# Patient Record
Sex: Male | Born: 1980 | Race: Black or African American | Hispanic: No | Marital: Single | Smoking: Never smoker
Health system: Southern US, Community
[De-identification: ages and names within clinical notes are randomized; demographics above are authoritative.]

## PROBLEM LIST (undated history)

## (undated) DIAGNOSIS — G43909 Migraine, unspecified, not intractable, without status migrainosus: Secondary | ICD-10-CM

---

## 2005-11-24 ENCOUNTER — Emergency Department (HOSPITAL_COMMUNITY): Admission: EM | Admit: 2005-11-24 | Discharge: 2005-11-24 | Payer: Self-pay | Admitting: Emergency Medicine

## 2006-03-06 ENCOUNTER — Emergency Department (HOSPITAL_COMMUNITY): Admission: EM | Admit: 2006-03-06 | Discharge: 2006-03-06 | Payer: Self-pay | Admitting: Family Medicine

## 2008-03-09 ENCOUNTER — Emergency Department (HOSPITAL_COMMUNITY): Admission: EM | Admit: 2008-03-09 | Discharge: 2008-03-09 | Payer: Self-pay | Admitting: Family Medicine

## 2011-09-28 ENCOUNTER — Emergency Department (INDEPENDENT_AMBULATORY_CARE_PROVIDER_SITE_OTHER): Payer: Self-pay

## 2011-09-28 ENCOUNTER — Emergency Department (INDEPENDENT_AMBULATORY_CARE_PROVIDER_SITE_OTHER)
Admission: EM | Admit: 2011-09-28 | Discharge: 2011-09-28 | Disposition: A | Discharge status: Home / Self Care | Payer: Self-pay | Source: Home / Self Care

## 2011-09-28 DIAGNOSIS — S139XXA Sprain of joints and ligaments of unspecified parts of neck, initial encounter: Secondary | ICD-10-CM

## 2011-09-28 DIAGNOSIS — S161XXA Strain of muscle, fascia and tendon at neck level, initial encounter: Secondary | ICD-10-CM

## 2011-09-28 MED ORDER — NAPROXEN 500 MG PO TABS: 500.0000 mg | ORAL_TABLET | Freq: Two times a day (BID) | ORAL | Status: AC

## 2011-09-28 MED ORDER — CYCLOBENZAPRINE HCL 5 MG PO TABS: 5.0000 mg | ORAL_TABLET | Freq: Three times a day (TID) | ORAL | Status: AC | PRN

## 2011-09-28 NOTE — ED Provider Notes (Signed)
History     CSN: [id number] Arrival date & time: 09/28/2011  4:35 PM   None     Chief Complaint  Patient presents with  . Numbness    Pt has lt arm numbness from elbow to top of head for three days, no known injury    (Consider location/radiation/quality/duration/timing/severity/associated sxs/prior treatment) HPI Comments: Pt woke up with feeling that arm, shoulder, neck and face on L side were numb, "like I slept on it funny". Gradually this feeling has changed to pain that is worsening in severity.  Denies injury. Pt says does get headaches, has been getting for years since adolescence, worse lately. Headaches are across forehead  Patient is a 30 y.o. male presenting with extremity pain.  Extremity Pain This is a new problem. Episode onset: 3 days. The problem occurs constantly. The problem has been gradually worsening. Associated symptoms include headaches. Pertinent negatives include no chest pain. Exacerbated by: sleeping and touching. The symptoms are relieved by nothing. He has tried nothing for the symptoms.    History reviewed. No pertinent past medical history.  History reviewed. No pertinent past surgical history.  History reviewed. No pertinent family history.  History  Substance Use Topics  . Smoking status: Never Smoker   . Smokeless tobacco: Not on file  . Alcohol Use: No      Review of Systems  Constitutional: Negative for fever and chills.  Cardiovascular: Negative for chest pain.  Musculoskeletal: Negative for back pain and joint swelling.  Skin: Negative for rash.  Neurological: Positive for numbness and headaches. Negative for weakness.    Allergies  Review of patient's allergies indicates no known allergies.  Home Medications   Current Outpatient Rx  Name Route Sig Dispense Refill  . CYCLOBENZAPRINE HCL 5 MG PO TABS Oral Take 1 tablet (5 mg total) by mouth 3 (three) times daily as needed for muscle spasms. 20 tablet 0  . NAPROXEN 500 MG PO  TABS Oral Take 1 tablet (500 mg total) by mouth 2 (two) times daily with a meal. 14 tablet 0    BP 136/82  Pulse 69  Temp(Src) 98.7 F (37.1 C) (Oral)  Resp 16  SpO2 97%  Physical Exam  Constitutional: He appears well-developed and well-nourished. No distress.  Neck: Normal range of motion. Neck supple. No edema and no erythema present.    Cardiovascular: Normal rate and regular rhythm.   Pulmonary/Chest: Effort normal and breath sounds normal.  Musculoskeletal: Normal range of motion. He exhibits no edema.       Right shoulder: He exhibits tenderness. He exhibits normal range of motion, no swelling, no effusion, no deformity and normal strength.       Arms: Neurological: He is alert. He has normal strength. No sensory deficit.    ED Course  Procedures (including critical care time)  Labs Reviewed - No data to display Dg Cervical Spine Complete  09/28/2011  *RADIOLOGY REPORT*  Clinical Data: Left-sided neck pain and numbness.  CERVICAL SPINE - COMPLETE 4+ VIEW  Comparison: None available.  Findings: The cervical spine is visualized from the skull base through the cervicothoracic junction.  The prevertebral soft tissues are normal.  The vertebral body heights and alignment are normal.  No acute fracture or traumatic subluxation is evident. There is straightening of the normal cervical lordosis.  IMPRESSION:  1.  No acute osseous abnormality. 2.  Mild straightening of the normal cervical lordosis.  This is nonspecific, but can be seen in the setting of muscle strain  or ongoing pain.  Original Report Authenticated By: Jamesetta Orleans. MATTERN, M.D.     1. Cervical muscle strain       MDM           Cathlyn Parsons, NP 09/28/11 [phone]

## 2011-09-28 NOTE — ED Provider Notes (Signed)
Medical screening examination/treatment/procedure(s) were performed by non-physician practitioner and as supervising physician I was immediately available for consultation/collaboration.  Raynald Blend, MD 09/28/11 2109

## 2013-08-30 ENCOUNTER — Emergency Department (INDEPENDENT_AMBULATORY_CARE_PROVIDER_SITE_OTHER)
Admission: EM | Admit: 2013-08-30 | Discharge: 2013-08-30 | Disposition: A | Discharge status: Home / Self Care | Payer: Self-pay | Source: Home / Self Care

## 2013-08-30 ENCOUNTER — Encounter (HOSPITAL_COMMUNITY): Payer: Self-pay | Admitting: Emergency Medicine

## 2013-08-30 DIAGNOSIS — K047 Periapical abscess without sinus: Secondary | ICD-10-CM

## 2013-08-30 DIAGNOSIS — K044 Acute apical periodontitis of pulpal origin: Secondary | ICD-10-CM

## 2013-08-30 MED ORDER — HYDROCODONE-ACETAMINOPHEN 5-325 MG PO TABS: 1.0000 | ORAL_TABLET | Freq: Four times a day (QID) | ORAL | Status: DC | PRN

## 2013-08-30 MED ORDER — CLINDAMYCIN HCL 300 MG PO CAPS: 300.0000 mg | ORAL_CAPSULE | Freq: Three times a day (TID) | ORAL | Status: DC

## 2013-08-30 NOTE — ED Notes (Signed)
Call from patient, unable to afford the antibiotics prescribed, asking for something less expensive. Spoke w DR Griffin Basil, who authorized Pen V K 500 mg  PO, QID, x #28, NR; called in to [facility] [address] at pt request, left message for pharmacy staff

## 2013-08-30 NOTE — ED Notes (Signed)
Toothache  X  2  Weeks  Facial swelling  Last  Pm

## 2013-08-30 NOTE — ED Provider Notes (Signed)
Joshua Spencer is a 32 y.o. male who presents to Urgent Care today for dental pain for 2 weeks. Patient has been taking Tylenol or ibuprofen for mild to moderate abdominal pain. However last night he had swelling of his right jaw. He notes mild chills but denies any fevers nausea vomiting or diarrhea. He is able to drink normally and is not having any trouble swallowing. He notes his pain is worse with eating. He feels well otherwise. He does not have dental insurance.   No past medical history on file. History  Substance Use Topics  . Smoking status: Never Smoker   . Smokeless tobacco: Not on file  . Alcohol Use: No   ROS as above Medications reviewed. No current facility-administered medications for this encounter.   Current Outpatient Prescriptions  Medication Sig Dispense Refill  . clindamycin (CLEOCIN) 300 MG capsule Take 1 capsule (300 mg total) by mouth 3 (three) times daily.  60 capsule  0  . HYDROcodone-acetaminophen (NORCO/VICODIN) 5-325 MG per tablet Take 1 tablet by mouth every 6 (six) hours as needed.  15 tablet  0    Exam:  BP 129/85  Pulse 84  Temp(Src) 98.7 F (37.1 C) (Oral)  Resp 16  SpO2 99% Gen: Well NAD HEENT: EOMI,  MMM, mild swelling of the right lower jaw.  Right lower premolar with large cavity and surrounding gumline erythema. No definitive abscess seen. Tender to touch   I looked at the patient up on the [place] controlled substances database. He has no narcotics for the last 3 months. No results found for this or any previous visit (from the past 24 hour(s)). No results found.  Assessment and Plan: 32 y.o. male with dental pain due to dental infection.  Plan to treat infection with clindamycin and pain with Norco. I have referred patient to perform full dentures and Dr. Lawrence Marseilles. Discussed warning signs or symptoms. Please see discharge instructions. Patient expresses understanding.      Rodolph Bong, MD 08/30/13 [phone]

## 2014-06-04 ENCOUNTER — Emergency Department (INDEPENDENT_AMBULATORY_CARE_PROVIDER_SITE_OTHER)
Admission: EM | Admit: 2014-06-04 | Discharge: 2014-06-04 | Disposition: A | Discharge status: Home / Self Care | Payer: Self-pay | Source: Home / Self Care

## 2014-06-04 ENCOUNTER — Encounter (HOSPITAL_COMMUNITY): Payer: Self-pay | Admitting: Emergency Medicine

## 2014-06-04 DIAGNOSIS — S339XXA Sprain of unspecified parts of lumbar spine and pelvis, initial encounter: Secondary | ICD-10-CM

## 2014-06-04 DIAGNOSIS — T148XXA Other injury of unspecified body region, initial encounter: Secondary | ICD-10-CM

## 2014-06-04 DIAGNOSIS — S335XXA Sprain of ligaments of lumbar spine, initial encounter: Secondary | ICD-10-CM

## 2014-06-04 DIAGNOSIS — S39012A Strain of muscle, fascia and tendon of lower back, initial encounter: Secondary | ICD-10-CM

## 2014-06-04 MED ORDER — KETOROLAC TROMETHAMINE 60 MG/2ML IM SOLN
60.0000 mg | Freq: Once | INTRAMUSCULAR | Status: AC
  Administered 2014-06-04: 60 mg via INTRAMUSCULAR

## 2014-06-04 MED ORDER — DICLOFENAC SODIUM 1 % TD GEL
1.0000 | Freq: Four times a day (QID) | TRANSDERMAL | Status: DC
Start: 2014-06-04 — End: 2016-10-24

## 2014-06-04 MED ORDER — METHYLPREDNISOLONE SODIUM SUCC 125 MG IJ SOLR
80.0000 mg | Freq: Once | INTRAMUSCULAR | Status: AC
  Administered 2014-06-04: 80 mg via INTRAMUSCULAR

## 2014-06-04 MED ORDER — KETOROLAC TROMETHAMINE 60 MG/2ML IM SOLN
INTRAMUSCULAR | Status: AC
  Filled 2014-06-04: qty 2

## 2014-06-04 MED ORDER — METHYLPREDNISOLONE SODIUM SUCC 125 MG IJ SOLR
INTRAMUSCULAR | Status: AC
  Filled 2014-06-04: qty 2

## 2014-06-04 MED ORDER — TRAMADOL HCL 50 MG PO TABS: 50.0000 mg | ORAL_TABLET | Freq: Four times a day (QID) | ORAL | Status: DC | PRN

## 2014-06-04 NOTE — ED Notes (Signed)
C/o pain low back since Monday or Tuesday. Denies radiation of pain or any numbness ; denies saddle anesthesia ; denies injury . Works at [employer], and his job involves Education officer, museumlifting electronic merchandise (TVs). Tried taking it a little easier , and using a foam support on his bed w/o relief

## 2014-06-04 NOTE — ED Provider Notes (Signed)
CSN: [id number]     Arrival date & time 06/04/14  1510 History   First MD Initiated Contact with Patient 06/04/14 1547     Chief Complaint  Patient presents with  . Back Pain   (Consider location/radiation/quality/duration/timing/severity/associated sxs/prior Treatment) HPI Comments: 33 year old male works at [employer] lifting moderate weight objects repetitively he is complaining of pain across the low back. This started approximately 5-6 days ago has been getting worse. He is trying to work through it. He is complaining of pain across the mid low paralumbar musculature. It is worse with bending, pulling, pushing and lifting. There is no radicular pain, radiation of pain, paresthesias or focal motor weakness.   History reviewed. No pertinent past medical history. History reviewed. No pertinent past surgical history. History reviewed. No pertinent family history. History  Substance Use Topics  . Smoking status: Never Smoker   . Smokeless tobacco: Not on file  . Alcohol Use: No    Review of Systems  Constitutional: Negative.   Respiratory: Negative.   Gastrointestinal: Negative.   Genitourinary: Negative.   Musculoskeletal: Positive for back pain. Negative for neck pain.       As per HPI  Skin: Negative.   Neurological: Positive for headaches. Negative for dizziness, weakness and numbness.    Allergies  Review of patient's allergies indicates no known allergies.  Home Medications   Prior to Admission medications   Medication Sig Start Date End Date Taking? Authorizing Provider  diclofenac sodium (VOLTAREN) 1 % GEL Apply 1 application topically 4 (four) times daily. 06/04/14   Hayden Rasmussenavid Oriyah Lamphear, NP  traMADol (ULTRAM) 50 MG tablet Take 1 tablet (50 mg total) by mouth every 6 (six) hours as needed. 06/04/14   Hayden Rasmussenavid Irasema Chalk, NP   BP 128/83  Pulse 79  Temp(Src) 98.6 F (37 C)  SpO2 97% Physical Exam  Nursing note and vitals reviewed. Constitutional: He is oriented to person, place, and  time. He appears well-developed and well-nourished. No distress.  Neck: Normal range of motion. Neck supple.  Cardiovascular: Normal rate.   Pulmonary/Chest: Effort normal. No respiratory distress.  Musculoskeletal: He exhibits no edema.  Tenderness across the mid lower back and para lumbar musculature. No palpable spinal deformity.  Neurological: He is alert and oriented to person, place, and time. He exhibits normal muscle tone.  Skin: Skin is warm and dry.  Psychiatric: He has a normal mood and affect.    ED Course  Procedures (including critical care time) Labs Review Labs Reviewed - No data to display  Imaging Review No results found.   MDM   1. Lumbosacral strain, initial encounter   2. Muscle strain    Diclofenac gel and cover with heat wraps. Limit lifting pulling and other maneuvers that increased stress on the back. Ultram 50 mg #15 Toradol 60 mg IM and a Medrol 80 mg IM now. Followup with PCP.      Hayden Rasmussenavid Zaineb Nowaczyk, NP 06/04/14 [phone]

## 2014-06-04 NOTE — Discharge Instructions (Signed)
Low Back Sprain with Rehab  A sprain is an injury in which a ligament is torn. The ligaments of the lower back are vulnerable to sprains. However, they are strong and require great force to be injured. These ligaments are important for stabilizing the spinal column. Sprains are classified into three categories. Grade 1 sprains cause pain, but the tendon is not lengthened. Grade 2 sprains include a lengthened ligament, due to the ligament being stretched or partially ruptured. With grade 2 sprains there is still function, although the function may be decreased. Grade 3 sprains involve a complete tear of the tendon or muscle, and function is usually impaired. SYMPTOMS   Severe pain in the lower back.  Sometimes, a feeling of a "pop," "snap," or tear, at the time of injury.  Tenderness and sometimes swelling at the injury site.  Uncommonly, bruising (contusion) within 48 hours of injury.  Muscle spasms in the back. CAUSES  Low back sprains occur when a force is placed on the ligaments that is greater than they can handle. Common causes of injury include:  Performing a stressful act while off-balance.  Repetitive stressful activities that involve movement of the lower back.  Direct hit (trauma) to the lower back. RISK INCREASES WITH:  Contact sports (football, wrestling).  Collisions (major skiing accidents).  Sports that require throwing or lifting (baseball, weightlifting).  Sports involving twisting of the spine (gymnastics, diving, tennis, golf).  Poor strength and flexibility.  Inadequate protection.  Previous back injury or surgery (especially fusion). PREVENTION  Wear properly fitted and padded protective equipment.  Warm up and stretch properly before activity.  Allow for adequate recovery between workouts.  Maintain physical fitness:  Strength, flexibility, and endurance.  Cardiovascular fitness.  Maintain a healthy body weight. PROGNOSIS  If treated  properly, low back sprains usually heal with non-surgical treatment. The length of time for healing depends on the severity of the injury.  RELATED COMPLICATIONS   Recurring symptoms, resulting in a chronic problem.  Chronic inflammation and pain in the low back.  Delayed healing or resolution of symptoms, especially if activity is resumed too soon.  Prolonged impairment.  Unstable or arthritic joints of the low back. TREATMENT  Treatment first involves the use of ice and medicine, to reduce pain and inflammation. The use of strengthening and stretching exercises may help reduce pain with activity. These exercises may be performed at home or with a therapist. Severe injuries may require referral to a therapist for further evaluation and treatment, such as ultrasound. Your caregiver may advise that you wear a back brace or corset, to help reduce pain and discomfort. Often, prolonged bed rest results in greater harm then benefit. Corticosteroid injections may be recommended. However, these should be reserved for the most serious cases. It is important to avoid using your back when lifting objects. At night, sleep on your back on a firm mattress, with a pillow placed under your knees. If non-surgical treatment is unsuccessful, surgery may be needed.  MEDICATION   If pain medicine is needed, nonsteroidal anti-inflammatory medicines (aspirin and ibuprofen), or other minor pain relievers (acetaminophen), are often advised.  Do not take pain medicine for 7 days before surgery.  Prescription pain relievers may be given, if your caregiver thinks they are needed. Use only as directed and only as much as you need.  Ointments applied to the skin may be helpful.  Corticosteroid injections may be given by your caregiver. These injections should be reserved for the most serious cases,   because they may only be given a certain number of times. HEAT AND COLD  Cold treatment (icing) should be applied for 10  to 15 minutes every 2 to 3 hours for inflammation and pain, and immediately after activity that aggravates your symptoms. Use ice packs or an ice massage.  Heat treatment may be used before performing stretching and strengthening activities prescribed by your caregiver, physical therapist, or athletic trainer. Use a heat pack or a warm water soak. SEEK MEDICAL CARE IF:   Symptoms get worse or do not improve in 2 to 4 weeks, despite treatment.  You develop numbness or weakness in either leg.  You lose bowel or bladder function.  Any of the following occur after surgery: fever, increased pain, swelling, redness, drainage of fluids, or bleeding in the affected area.  New, unexplained symptoms develop. (Drugs used in treatment may produce side effects.) EXERCISES  RANGE OF MOTION (ROM) AND STRETCHING EXERCISES - Low Back Sprain Most people with lower back pain will find that their symptoms get worse with excessive bending forward (flexion) or arching at the lower back (extension). The exercises that will help resolve your symptoms will focus on the opposite motion.  Your physician, physical therapist or athletic trainer will help you determine which exercises will be most helpful to resolve your lower back pain. Do not complete any exercises without first consulting with your caregiver. Discontinue any exercises which make your symptoms worse, until you speak to your caregiver. If you have pain, numbness or tingling which travels down into your buttocks, leg or foot, the goal of the therapy is for these symptoms to move closer to your back and eventually resolve. Sometimes, these leg symptoms will get better, but your lower back pain may worsen. This is often an indication of progress in your rehabilitation. Be very alert to any changes in your symptoms and the activities in which you participated in the 24 hours prior to the change. Sharing this information with your caregiver will allow him or her to  most efficiently treat your condition. These exercises may help you when beginning to rehabilitate your injury. Your symptoms may resolve with or without further involvement from your physician, physical therapist or athletic trainer. While completing these exercises, remember:   Restoring tissue flexibility helps normal motion to return to the joints. This allows healthier, less painful movement and activity.  An effective stretch should be held for at least 30 seconds.  A stretch should never be painful. You should only feel a gentle lengthening or release in the stretched tissue. FLEXION RANGE OF MOTION AND STRETCHING EXERCISES: STRETCH - Flexion, Single Knee to Chest   Lie on a firm bed or floor with both legs extended in front of you.  Keeping one leg in contact with the floor, bring your opposite knee to your chest. Hold your leg in place by either grabbing behind your thigh or at your knee.  Pull until you feel a gentle stretch in your low back. Hold __________ seconds.  Slowly release your grasp and repeat the exercise with the opposite side. Repeat __________ times. Complete this exercise __________ times per day.  STRETCH - Flexion, Double Knee to Chest  Lie on a firm bed or floor with both legs extended in front of you.  Keeping one leg in contact with the floor, bring your opposite knee to your chest.  Tense your stomach muscles to support your back and then lift your other knee to your chest. Hold your legs   in place by either grabbing behind your thighs or at your knees.  Pull both knees toward your chest until you feel a gentle stretch in your low back. Hold __________ seconds.  Tense your stomach muscles and slowly return one leg at a time to the floor. Repeat __________ times. Complete this exercise __________ times per day.  STRETCH - Low Trunk Rotation  Lie on a firm bed or floor. Keeping your legs in front of you, bend your knees so they are both pointed toward the  ceiling and your feet are flat on the floor.  Extend your arms out to the side. This will stabilize your upper body by keeping your shoulders in contact with the floor.  Gently and slowly drop both knees together to one side until you feel a gentle stretch in your low back. Hold for __________ seconds.  Tense your stomach muscles to support your lower back as you bring your knees back to the starting position. Repeat the exercise to the other side. Repeat __________ times. Complete this exercise __________ times per day  EXTENSION RANGE OF MOTION AND FLEXIBILITY EXERCISES: STRETCH - Extension, Prone on Elbows   Lie on your stomach on the floor, a bed will be too soft. Place your palms about shoulder width apart and at the height of your head.  Place your elbows under your shoulders. If this is too painful, stack pillows under your chest.  Allow your body to relax so that your hips drop lower and make contact more completely with the floor.  Hold this position for __________ seconds.  Slowly return to lying flat on the floor. Repeat __________ times. Complete this exercise __________ times per day.  RANGE OF MOTION - Extension, Prone Press Ups  Lie on your stomach on the floor, a bed will be too soft. Place your palms about shoulder width apart and at the height of your head.  Keeping your back as relaxed as possible, slowly straighten your elbows while keeping your hips on the floor. You may adjust the placement of your hands to maximize your comfort. As you gain motion, your hands will come more underneath your shoulders.  Hold this position __________ seconds.  Slowly return to lying flat on the floor. Repeat __________ times. Complete this exercise __________ times per day.  RANGE OF MOTION- Quadruped, Neutral Spine   Assume a hands and knees position on a firm surface. Keep your hands under your shoulders and your knees under your hips. You may place padding under your knees for  comfort.  Drop your head and point your tailbone toward the ground below you. This will round out your lower back like an angry cat. Hold this position for __________ seconds.  Slowly lift your head and release your tail bone so that your back sags into a large arch, like an old horse.  Hold this position for __________ seconds.  Repeat this until you feel limber in your low back.  Now, find your "sweet spot." This will be the most comfortable position somewhere between the two previous positions. This is your neutral spine. Once you have found this position, tense your stomach muscles to support your low back.  Hold this position for __________ seconds. Repeat __________ times. Complete this exercise __________ times per day.  STRENGTHENING EXERCISES - Low Back Sprain These exercises may help you when beginning to rehabilitate your injury. These exercises should be done near your "sweet spot." This is the neutral, low-back arch, somewhere between fully rounded   and fully arched, that is your least painful position. When performed in this safe range of motion, these exercises can be used for people who have either a flexion or extension based injury. These exercises may resolve your symptoms with or without further involvement from your physician, physical therapist or athletic trainer. While completing these exercises, remember:   Muscles can gain both the endurance and the strength needed for everyday activities through controlled exercises.  Complete these exercises as instructed by your physician, physical therapist or athletic trainer. Increase the resistance and repetitions only as guided.  You may experience muscle soreness or fatigue, but the pain or discomfort you are trying to eliminate should never worsen during these exercises. If this pain does worsen, stop and make certain you are following the directions exactly. If the pain is still present after adjustments, discontinue the  exercise until you can discuss the trouble with your caregiver. STRENGTHENING - Deep Abdominals, Pelvic Tilt   Lie on a firm bed or floor. Keeping your legs in front of you, bend your knees so they are both pointed toward the ceiling and your feet are flat on the floor.  Tense your lower abdominal muscles to press your low back into the floor. This motion will rotate your pelvis so that your tail bone is scooping upwards rather than pointing at your feet or into the floor. With a gentle tension and even breathing, hold this position for __________ seconds. Repeat __________ times. Complete this exercise __________ times per day.  STRENGTHENING - Abdominals, Crunches   Lie on a firm bed or floor. Keeping your legs in front of you, bend your knees so they are both pointed toward the ceiling and your feet are flat on the floor. Cross your arms over your chest.  Slightly tip your chin down without bending your neck.  Tense your abdominals and slowly lift your trunk high enough to just clear your shoulder blades. Lifting higher can put excessive stress on the lower back and does not further strengthen your abdominal muscles.  Control your return to the starting position. Repeat __________ times. Complete this exercise __________ times per day.  STRENGTHENING - Quadruped, Opposite UE/LE Lift   Assume a hands and knees position on a firm surface. Keep your hands under your shoulders and your knees under your hips. You may place padding under your knees for comfort.  Find your neutral spine and gently tense your abdominal muscles so that you can maintain this position. Your shoulders and hips should form a rectangle that is parallel with the floor and is not twisted.  Keeping your trunk steady, lift your right hand no higher than your shoulder and then your left leg no higher than your hip. Make sure you are not holding your breath. Hold this position for __________ seconds.  Continuing to keep  your abdominal muscles tense and your back steady, slowly return to your starting position. Repeat with the opposite arm and leg. Repeat __________ times. Complete this exercise __________ times per day.  STRENGTHENING - Abdominals and Quadriceps, Straight Leg Raise   Lie on a firm bed or floor with both legs extended in front of you.  Keeping one leg in contact with the floor, bend the other knee so that your foot can rest flat on the floor.  Find your neutral spine, and tense your abdominal muscles to maintain your spinal position throughout the exercise.  Slowly lift your straight leg off the floor about 6 inches for a count   of 15, making sure to not hold your breath.  Still keeping your neutral spine, slowly lower your leg all the way to the floor. Repeat this exercise with each leg __________ times. Complete this exercise __________ times per day. POSTURE AND BODY MECHANICS CONSIDERATIONS - Low Back Sprain Keeping correct posture when sitting, standing or completing your activities will reduce the stress put on different body tissues, allowing injured tissues a chance to heal and limiting painful experiences. The following are general guidelines for improved posture. Your physician or physical therapist will provide you with any instructions specific to your needs. While reading these guidelines, remember:  The exercises prescribed by your provider will help you have the flexibility and strength to maintain correct postures.  The correct posture provides the best environment for your joints to work. All of your joints have less wear and tear when properly supported by a spine with good posture. This means you will experience a healthier, less painful body.  Correct posture must be practiced with all of your activities, especially prolonged sitting and standing. Correct posture is as important when doing repetitive low-stress activities (typing) as it is when doing a single heavy-load  activity (lifting). RESTING POSITIONS Consider which positions are most painful for you when choosing a resting position. If you have pain with flexion-based activities (sitting, bending, stooping, squatting), choose a position that allows you to rest in a less flexed posture. You would want to avoid curling into a fetal position on your side. If your pain worsens with extension-based activities (prolonged standing, working overhead), avoid resting in an extended position such as sleeping on your stomach. Most people will find more comfort when they rest with their spine in a more neutral position, neither too rounded nor too arched. Lying on a non-sagging bed on your side with a pillow between your knees, or on your back with a pillow under your knees will often provide some relief. Keep in mind, being in any one position for a prolonged period of time, no matter how correct your posture, can still lead to stiffness. PROPER SITTING POSTURE In order to minimize stress and discomfort on your spine, you must sit with correct posture. Sitting with good posture should be effortless for a healthy body. Returning to good posture is a gradual process. Many people can work toward this most comfortably by using various supports until they have the flexibility and strength to maintain this posture on their own. When sitting with proper posture, your ears will fall over your shoulders and your shoulders will fall over your hips. You should use the back of the chair to support your upper back. Your lower back will be in a neutral position, just slightly arched. You may place a small pillow or folded towel at the base of your lower back for  support.  When working at a desk, create an environment that supports good, upright posture. Without extra support, muscles tire, which leads to excessive strain on joints and other tissues. Keep these recommendations in mind: CHAIR:  A chair should be able to slide under your desk  when your back makes contact with the back of the chair. This allows you to work closely.  The chair's height should allow your eyes to be level with the upper part of your monitor and your hands to be slightly lower than your elbows. BODY POSITION  Your feet should make contact with the floor. If this is not possible, use a foot rest.  Keep your   ears over your shoulders. This will reduce stress on your neck and low back. INCORRECT SITTING POSTURES  If you are feeling tired and unable to assume a healthy sitting posture, do not slouch or slump. This puts excessive strain on your back tissues, causing more damage and pain. Healthier options include:  Using more support, like a lumbar pillow.  Switching tasks to something that requires you to be upright or walking.  Talking a brief walk.  Lying down to rest in a neutral-spine position. PROLONGED STANDING WHILE SLIGHTLY LEANING FORWARD  When completing a task that requires you to lean forward while standing in one place for a long time, place either foot up on a stationary 2-4 inch high object to help maintain the best posture. When both feet are on the ground, the lower back tends to lose its slight inward curve. If this curve flattens (or becomes too large), then the back and your other joints will experience too much stress, tire more quickly, and can cause pain. CORRECT STANDING POSTURES Proper standing posture should be assumed with all daily activities, even if they only take a few moments, like when brushing your teeth. As in sitting, your ears should fall over your shoulders and your shoulders should fall over your hips. You should keep a slight tension in your abdominal muscles to brace your spine. Your tailbone should point down to the ground, not behind your body, resulting in an over-extended swayback posture.  INCORRECT STANDING POSTURES  Common incorrect standing postures include a forward head, locked knees and/or an excessive  swayback. WALKING Walk with an upright posture. Your ears, shoulders and hips should all line-up. PROLONGED ACTIVITY IN A FLEXED POSITION When completing a task that requires you to bend forward at your waist or lean over a low surface, try to find a way to stabilize 3 out of 4 of your limbs. You can place a hand or elbow on your thigh or rest a knee on the surface you are reaching across. This will provide you more stability, so that your muscles do not tire as quickly. By keeping your knees relaxed, or slightly bent, you will also reduce stress across your lower back. CORRECT LIFTING TECHNIQUES DO :  Assume a wide stance. This will provide you more stability and the opportunity to get as close as possible to the object which you are lifting.  Tense your abdominals to brace your spine. Bend at the knees and hips. Keeping your back locked in a neutral-spine position, lift using your leg muscles. Lift with your legs, keeping your back straight.  Test the weight of unknown objects before attempting to lift them.  Try to keep your elbows locked down at your sides in order get the best strength from your shoulders when carrying an object.  Always ask for help when lifting heavy or awkward objects. INCORRECT LIFTING TECHNIQUES DO NOT:   Lock your knees when lifting, even if it is a small object.  Bend and twist. Pivot at your feet or move your feet when needing to change directions.  Assume that you can safely pick up even a paperclip without proper posture. Document Released: 10/07/2005 Document Revised: 12/30/2011 Document Reviewed: 01/19/2009 [facility] Patient Information 2015 [place], [place]. This information is not intended to replace advice given to you by your health care provider. Make sure you discuss any questions you have with your health care provider.  Muscle Strain A muscle strain (pulled muscle) happens when a muscle is stretched beyond normal  length. It happens when a sudden,  violent force stretches your muscle too far. Usually, a few of the fibers in your muscle are torn. Muscle strain is common in athletes. Recovery usually takes 1-2 weeks. Complete healing takes 5-6 weeks.  HOME CARE   Follow the PRICE method of treatment to help your injury get better. Do this the first 2-3 days after the injury:  Protect. Protect the muscle to keep it from getting injured again.  Rest. Limit your activity and rest the injured body part.  Ice. Put ice in a plastic bag. Place a towel between your skin and the bag. Then, apply the ice and leave it on from 15-20 minutes each hour. After the third day, switch to moist heat packs.  Compression. Use a splint or elastic bandage on the injured area for comfort. Do not put it on too tightly.  Elevate. Keep the injured body part above the level of your heart.  Only take medicine as told by your doctor.  Warm up before doing exercise to prevent future muscle strains. GET HELP IF:   You have more pain or puffiness (swelling) in the injured area.  You feel numbness, tingling, or notice a loss of strength in the injured area. MAKE SURE YOU:   Understand these instructions.  Will watch your condition.  Will get help right away if you are not doing well or get worse. Document Released: 07/16/2008 Document Revised: 07/28/2013 Document Reviewed: 05/06/2013 [facility] Patient Information 2015 [place], [place]. This information is not intended to replace advice given to you by your health care provider. Make sure you discuss any questions you have with your health care provider.  Repetitive Strain Injuries Repetitive strain injuries (RSIs) result from overuse or misuse of soft tissues including muscles, tendons, or nerves. Tendons are the cord-like structures that attach muscles to bones. RSIs can affect almost any part of the body. However, RSIs are most common in the arms (thumbs, wrists, elbows, shoulders) and legs (ankles, knees).  Common medical conditions that are often caused by repetitive strain include carpal tunnel syndrome, tennis or golfer's elbow, bursitis, and tendonitis. If RSIs are treated early, and therepeated activity is reduced or removed, the severity and length of your problems can usually be reduced. RSIs are also called cumulative trauma disorders (CTD).  CAUSES  Many RSIs occur due to repeating the same activity at work over weeks or months without sufficient rest, such as prolonged typing. RSIs also commonly occur when a hobby or sport is done repeatedly without sufficient rest. RSIs can also occur due to repeated strain or stress on a body part in someone who has one or more risk factors for RSIs. RISK FACTORS Workplace risk factors  Frequent computer use, especially if your workstation is not adjusted for your body type.  Infrequent rest breaks.  Working in a high-pressure environment.  Working at a [employer].  Repeating the same motion, such as frequent typing.  Working in an awkward position or holding the same position for a long time.  Forceful movements such as lifting, pulling, or pushing.  Vibration caused by using power tools.  Working in cold temperatures.  Job stress. Personal risk factors  Poor posture.  Being loose-jointed.  Not exercising regularly.  Being overweight.  Arthritis, diabetes, thyroid problems, or other long-term (chronic)medical conditions.  Vitamin deficiencies.  Keeping your fingernails long.  An unhealthy, stressful, or inactive lifestyle.  Not sleeping well. SYMPTOMS  Symptoms often begin at work but become more noticeable  after the repeated stress has ended. For example, you may develop fatigue or soreness in your wrist while typingat work, and at night you may develop numbness and tingling in your fingers. Common symptoms include:   Burning, shooting, or aching pain, especially in the fingers, palms, wrists, forearms, or  shoulders.  Tenderness.  Swelling.  Tingling, numbness, or loss of feeling.  Pain with certain activities, such as turning a doorknob or reaching above your head.  Weakness, heaviness, or loss of coordination in yourhand.  Muscle spasms or tightness. In some cases, symptoms can become so intense that it is difficult to perform everyday tasks. Symptoms that do not improve with rest may indicate a more serious condition.  DIAGNOSIS  Your caregiver may determine the type ofRSI you have based on your medical evaluation and a description of your activities.  TREATMENT  Treatment depends on the severity and type of RSI you have. Your caregiver may recommend rest for the affected body part, medicines, and physical or occupational therapy to reduce pain, swelling, and soreness. Discuss the activities you do repeatedly with your caregiver. Your caregiver can help you decide whether you need to change your activities. An RSI may take months or years to heal, especially if the affected body part gets insufficient rest. In some cases, such as severe carpal tunnel syndrome, surgery may be recommended. PREVENTION  Talk with your supervisor to make sure you have the proper equipment for your work station.  Maintain good posture at your desk or work station with:  Feet flat on the floor.  Knees directly over the feet, bent at a right angle.  Lower back supported by your chair or a cushion in the curve of your lower back.  Shoulders and arms relaxed and at your sides.  Neck relaxed and not bent forwards or backwards.  Your desk and computer workstation properly adjusted to your body type.  Your chair adjusted so there is no excess pressure on the back of your thighs.  The keyboard resting above your thighs. You should be able to reach the keys with your elbows at your side, bent at a right angle. Your arms should be supported on forearm rests, with your forearms parallel to the ground.  The  computer mouse within easy reach.  The monitor directly in front of you, so that your eyes are aligned with the top of the screen. The screen should be about 15 to 25 inches from your eyes.  While typing, keep your wrist straight, in a neutral position. Move your entire arm when you move your mouse or when typing hard-to-reach keys.  Only use your computer as much as you need to for work. Do not use it during breaks.  Take breaks often from any repeated activity. Alternate with another task which requires you to use different muscles, or rest at least once every hour.  Change positions regularly. If you spend a lot of time sitting, get up, walk around, and stretch.  Do not hold pens or pencils tightly when writing.  Exercise regularly.  Maintain a normal weight.  Eat a diet with plenty of vegetables, whole grains, and fruit.  Get sufficient, restful sleep. HOME CARE INSTRUCTIONS  If your caregiver prescribed medicine to help reduce swelling, take it as directed.  Only take over-the-counter or prescription medicines for pain, discomfort, or fever as directed by your caregiver.  Reduce, and if needed, stopthe activities that are causing your problems until you have no further symptoms.If your symptoms  are work-related, you may need to talk to your supervisor about changing your activities.  When symptoms develop, put ice or a cold pack on the aching area.  Put ice in a plastic bag.  Place a towel between your skin and the bag.  Leave the ice on for 15-20 minutes.  If you were given a splint to keep your wrist from bending, wear it as instructed. It is important to wear the splint at night. Use the splint for as long as your caregiver recommends. SEEK MEDICAL CARE IF:  You develop new problems.  Your problems do not get better with medicine. MAKE SURE YOU:  Understand these instructions.  Will watch your condition.  Will get help right away if you are not doing well or  get worse. Document Released: 09/27/2002 Document Revised: 04/07/2012 Document Reviewed: 11/28/2011 [facility] Patient Information 2015 [place], [place]. This information is not intended to replace advice given to you by your health care provider. Make sure you discuss any questions you have with your health care provider.

## 2014-06-04 NOTE — ED Provider Notes (Signed)
Medical screening examination/treatment/procedure(s) were performed by resident physician or non-physician practitioner and as supervising physician I was immediately available for consultation/collaboration.   Aalani Aikens DOUGLAS MD.   Maleiyah Releford D Hiyab Nhem, MD 06/04/14 1923 

## 2015-07-17 ENCOUNTER — Encounter (HOSPITAL_COMMUNITY): Payer: Self-pay | Admitting: *Deleted

## 2015-07-17 ENCOUNTER — Emergency Department (INDEPENDENT_AMBULATORY_CARE_PROVIDER_SITE_OTHER)
Admission: EM | Admit: 2015-07-17 | Discharge: 2015-07-17 | Disposition: A | Discharge status: Home / Self Care | Payer: BLUE CROSS/BLUE SHIELD | Source: Home / Self Care

## 2015-07-17 DIAGNOSIS — K529 Noninfective gastroenteritis and colitis, unspecified: Secondary | ICD-10-CM

## 2015-07-17 MED ORDER — ONDANSETRON 4 MG PO TBDP
4.0000 mg | ORAL_TABLET | Freq: Once | ORAL | Status: AC
  Administered 2015-07-17: 4 mg via ORAL

## 2015-07-17 MED ORDER — ONDANSETRON 4 MG PO TBDP
ORAL_TABLET | ORAL | Status: AC
  Filled 2015-07-17: qty 1

## 2015-07-17 MED ORDER — PROMETHAZINE HCL 25 MG PO TABS: 25.0000 mg | ORAL_TABLET | Freq: Four times a day (QID) | ORAL | Status: DC | PRN

## 2015-07-17 NOTE — Discharge Instructions (Signed)
Clear liquid  diet tonight as tolerated, advance on tues as improved, use medicine as needed, imodium for diarrhea, return or see your doctor if any problems.

## 2015-07-17 NOTE — ED Provider Notes (Signed)
CSN: [id number]     Arrival date & time 07/17/15  1813 History   None    Chief Complaint  Patient presents with  . Abdominal Pain   (Consider location/radiation/quality/duration/timing/severity/associated sxs/prior Treatment) Patient is a 34 y.o. male presenting with abdominal pain. The history is provided by the patient.  Abdominal Pain Pain location:  Epigastric Pain quality: cramping   Pain radiates to:  Does not radiate Pain severity:  Mild Onset quality:  Gradual Progression:  Unchanged Chronicity:  New Relieved by:  None tried Worsened by:  Nothing tried Ineffective treatments:  None tried Associated symptoms: diarrhea   Associated symptoms: no chills, no dysuria, no fever and no vomiting     History reviewed. No pertinent past medical history. History reviewed. No pertinent past surgical history. History reviewed. No pertinent family history. Social History  Substance Use Topics  . Smoking status: Never Smoker   . Smokeless tobacco: None  . Alcohol Use: No    Review of Systems  Constitutional: Negative.  Negative for fever and chills.  HENT: Negative.   Gastrointestinal: Positive for abdominal pain and diarrhea. Negative for vomiting.  Genitourinary: Negative.  Negative for dysuria.    Allergies  Review of patient's allergies indicates no known allergies.  Home Medications   Prior to Admission medications   Medication Sig Start Date End Date Taking? Authorizing Provider  diclofenac sodium (VOLTAREN) 1 % GEL Apply 1 application topically 4 (four) times daily. 06/04/14   Hayden Rasmussen, NP  promethazine (PHENERGAN) 25 MG tablet Take 1 tablet (25 mg total) by mouth every 6 (six) hours as needed for nausea or vomiting. 07/17/15   Linna Hoff, MD  traMADol (ULTRAM) 50 MG tablet Take 1 tablet (50 mg total) by mouth every 6 (six) hours as needed. 06/04/14   Hayden Rasmussen, NP   Meds Ordered and Administered this Visit   Medications  ondansetron (ZOFRAN-ODT)  disintegrating tablet 4 mg (not administered)    BP 120/70 mmHg  Pulse 78  Temp(Src) 98.6 F (37 C) (Oral)  Resp 18  SpO2 100% No data found.   Physical Exam  Constitutional: He is oriented to person, place, and time. He appears well-developed and well-nourished. No distress.  Neck: Normal range of motion. Neck supple.  Cardiovascular: Normal heart sounds.   Pulmonary/Chest: Effort normal and breath sounds normal.  Abdominal: Soft. Bowel sounds are normal. He exhibits no distension and no mass. There is no tenderness. There is no rebound and no guarding.  Lymphadenopathy:    He has no cervical adenopathy.  Neurological: He is alert and oriented to person, place, and time.  Skin: Skin is warm and dry.  Nursing note and vitals reviewed.   ED Course  Procedures (including critical care time)  Labs Review Labs Reviewed - No data to display  Imaging Review No results found.   Visual Acuity Review  Right Eye Distance:   Left Eye Distance:   Bilateral Distance:    Right Eye Near:   Left Eye Near:    Bilateral Near:         MDM   1. Gastroenteritis, acute        Linna Hoff, MD 07/17/15 2005

## 2015-07-17 NOTE — ED Notes (Signed)
Ppt  Reports  Symptoms  Of  Abdominal  Pain  With    Nausea     X  2  Days  Ago        He reports  Some  Diarrhea  But  No  Vomiting       He  Is  Sitting  uprght on the  Exam table  Speaking in  Complete  sentances  And  Is  In no  Severe  Distress

## 2015-12-01 ENCOUNTER — Other Ambulatory Visit (HOSPITAL_COMMUNITY)
Admission: RE | Admit: 2015-12-01 | Discharge: 2015-12-01 | Disposition: A | Discharge status: Home / Self Care | Payer: BLUE CROSS/BLUE SHIELD | Source: Ambulatory Visit | Attending: Emergency Medicine | Admitting: Emergency Medicine

## 2015-12-01 ENCOUNTER — Encounter (HOSPITAL_COMMUNITY): Payer: Self-pay | Admitting: Emergency Medicine

## 2015-12-01 ENCOUNTER — Emergency Department (INDEPENDENT_AMBULATORY_CARE_PROVIDER_SITE_OTHER)
Admission: EM | Admit: 2015-12-01 | Discharge: 2015-12-01 | Disposition: A | Discharge status: Home / Self Care | Payer: BLUE CROSS/BLUE SHIELD | Source: Home / Self Care | Attending: Emergency Medicine | Admitting: Emergency Medicine

## 2015-12-01 DIAGNOSIS — J111 Influenza due to unidentified influenza virus with other respiratory manifestations: Secondary | ICD-10-CM | POA: Diagnosis not present

## 2015-12-01 LAB — POCT RAPID STREP A: STREPTOCOCCUS, GROUP A SCREEN (DIRECT): NEGATIVE

## 2015-12-01 MED ORDER — OSELTAMIVIR PHOSPHATE 75 MG PO CAPS: 75.0000 mg | ORAL_CAPSULE | Freq: Two times a day (BID) | ORAL | Status: DC

## 2015-12-01 NOTE — Discharge Instructions (Signed)

## 2015-12-01 NOTE — ED Provider Notes (Signed)
CSN: [id number]     Arrival date & time 12/01/15  1913 History   First MD Initiated Contact with Patient 12/01/15 1957     Chief Complaint  Patient presents with  . URI   (Consider location/radiation/quality/duration/timing/severity/associated sxs/prior Treatment) HPI Patient has had fever and body aches nausea with an occasional vomit over the last 24-36 hours. He states that he is also having tactile fever. Does not have at the monitor at home but feels hot. There are others in his home that have been ill also with similar symptoms. Is given a flu shot this year. States he has no other medical problems. Has been tried TheraFlu at home with out good relief. History reviewed. No pertinent past medical history. History reviewed. No pertinent past surgical history. No family history on file. Social History  Substance Use Topics  . Smoking status: Never Smoker   . Smokeless tobacco: None  . Alcohol Use: No    Review of Systems ROS +'ve body aches, fever  Denies: HEADACHE, NAUSEA, ABDOMINAL PAIN, CHEST PAIN, CONGESTION, DYSURIA, SHORTNESS OF BREATH  Allergies  Review of patient's allergies indicates no known allergies.  Home Medications   Prior to Admission medications   Medication Sig Start Date End Date Taking? Authorizing Provider  diclofenac sodium (VOLTAREN) 1 % GEL Apply 1 application topically 4 (four) times daily. 06/04/14   Hayden Rasmussen, NP  oseltamivir (TAMIFLU) 75 MG capsule Take 1 capsule (75 mg total) by mouth every 12 (twelve) hours. 12/01/15   Tharon Aquas, PA  promethazine (PHENERGAN) 25 MG tablet Take 1 tablet (25 mg total) by mouth every 6 (six) hours as needed for nausea or vomiting. 07/17/15   Linna Hoff, MD  traMADol (ULTRAM) 50 MG tablet Take 1 tablet (50 mg total) by mouth every 6 (six) hours as needed. 06/04/14   Hayden Rasmussen, NP   Meds Ordered and Administered this Visit  Medications - No data to display  BP 134/72 mmHg  Pulse 75  Temp(Src) 98 F (36.7 C)  (Oral)  Resp 20  SpO2 98% No data found.   Physical Exam  Constitutional: He is oriented to person, place, and time. He appears well-developed and well-nourished.  HENT:  Head: Normocephalic and atraumatic.  Mouth/Throat: No oropharyngeal exudate.  Eyes: Conjunctivae are normal.  Neck: Normal range of motion. Neck supple.  Pulmonary/Chest: Effort normal and breath sounds normal.  Neurological: He is alert and oriented to person, place, and time.  Skin: Skin is warm and dry.  Psychiatric: He has a normal mood and affect. His behavior is normal.  Nursing note and vitals reviewed.   ED Course  Procedures (including critical care time)  Labs Review Labs Reviewed  POCT RAPID STREP A    Imaging Review No results found.   Visual Acuity Review  Right Eye Distance:   Left Eye Distance:   Bilateral Distance:    Right Eye Near:   Left Eye Near:    Bilateral Near:         MDM   1. Influenza    Patient is advised to continue home symptomatic treatment. Prescription for  Tamiflu sent pharmacy patient has indicated. Patient is advised that if there are new or worsening symptoms or attend the emergency department, or contact primary care provider. Instructions of care provided discharged home in stable condition. Return to work/school note provided.  THIS NOTE WAS GENERATED USING A VOICE RECOGNITION SOFTWARE PROGRAM. ALL REASONABLE EFFORTS  WERE MADE TO PROOFREAD THIS DOCUMENT FOR  ACCURACY.     Tharon Aquas, [place] 12/01/15 2032

## 2015-12-01 NOTE — ED Notes (Signed)
C/o cold sx onset x2 days associated w/ST, runny nose, fevers, emesis, congestion A&O x4.... No acute distress.

## 2015-12-04 LAB — CULTURE, GROUP A STREP (THRC)

## 2016-06-01 ENCOUNTER — Encounter (HOSPITAL_COMMUNITY): Payer: Self-pay | Admitting: *Deleted

## 2016-06-01 ENCOUNTER — Ambulatory Visit (HOSPITAL_COMMUNITY)
Admission: EM | Admit: 2016-06-01 | Discharge: 2016-06-01 | Disposition: A | Discharge status: Home / Self Care | Payer: BLUE CROSS/BLUE SHIELD | Attending: Family Medicine | Admitting: Family Medicine

## 2016-06-01 DIAGNOSIS — R519 Headache, unspecified: Secondary | ICD-10-CM

## 2016-06-01 DIAGNOSIS — R51 Headache: Secondary | ICD-10-CM | POA: Diagnosis not present

## 2016-06-01 LAB — POCT I-STAT, CHEM 8
BUN: 10 mg/dL (ref 6–20)
CALCIUM ION: 1.16 mmol/L (ref 1.13–1.30)
CHLORIDE: 101 mmol/L (ref 101–111)
CREATININE: 1 mg/dL (ref 0.61–1.24)
GLUCOSE: 108 mg/dL — AB (ref 65–99)
HCT: 47 % (ref 39.0–52.0)
Hemoglobin: 16 g/dL (ref 13.0–17.0)
POTASSIUM: 3.8 mmol/L (ref 3.5–5.1)
Sodium: 139 mmol/L (ref 135–145)
TCO2: 26 mmol/L (ref 0–100)

## 2016-06-01 MED ORDER — TRAZODONE HCL 100 MG PO TABS: 100.0000 mg | ORAL_TABLET | Freq: Every day | ORAL | 1 refills | Status: DC

## 2016-06-01 NOTE — ED Provider Notes (Signed)
[facility]    CSN: [id number] Arrival date & time: 06/01/16  1255  First Provider Contact:  First MD Initiated Contact with Patient 06/01/16 1318        History   Chief Complaint Chief Complaint  Patient presents with  . Headache    HPI Joshua Spencer is a 35 y.o. male.    Headache  Pain location:  L temporal and R temporal Quality:  Sharp Radiates to:  Does not radiate Onset quality:  Gradual Timing:  Intermittent Chronicity:  Recurrent Similar to prior headaches: yes   Context: not exposure to bright light and not caffeine   Relieved by:  Nothing Worsened by:  Nothing Ineffective treatments:  None tried Associated symptoms: no blurred vision, no dizziness, no eye pain, no fever, no focal weakness, no loss of balance, no nausea, no numbness, no photophobia, no visual change and no vomiting     History reviewed. No pertinent past medical history.  There are no active problems to display for this patient.   History reviewed. No pertinent surgical history.     Home Medications    Prior to Admission medications   Medication Sig Start Date End Date Taking? Authorizing Provider  diclofenac sodium (VOLTAREN) 1 % GEL Apply 1 application topically 4 (four) times daily. 06/04/14   Hayden Rasmussen, NP  oseltamivir (TAMIFLU) 75 MG capsule Take 1 capsule (75 mg total) by mouth every 12 (twelve) hours. 12/01/15   Tharon Aquas, PA  promethazine (PHENERGAN) 25 MG tablet Take 1 tablet (25 mg total) by mouth every 6 (six) hours as needed for nausea or vomiting. 07/17/15   Linna Hoff, MD  traMADol (ULTRAM) 50 MG tablet Take 1 tablet (50 mg total) by mouth every 6 (six) hours as needed. 06/04/14   Hayden Rasmussen, NP    Family History No family history on file.  Social History Social History  Substance Use Topics  . Smoking status: Never Smoker  . Smokeless tobacco: Not on file  . Alcohol use No     Allergies   Review of patient's allergies indicates no known  allergies.   Review of Systems Review of Systems  Constitutional: Negative.  Negative for fever.  Eyes: Negative.  Negative for blurred vision, photophobia and pain.  Respiratory: Negative.   Cardiovascular: Negative.   Gastrointestinal: Negative.  Negative for nausea and vomiting.  Musculoskeletal: Negative.   Skin: Negative.   Neurological: Positive for headaches. Negative for dizziness, focal weakness, speech difficulty, numbness and loss of balance.  Psychiatric/Behavioral: Positive for sleep disturbance.  All other systems reviewed and are negative.    Physical Exam Triage Vital Signs ED Triage Vitals  Enc Vitals Group     BP 06/01/16 1312 137/90     Pulse Rate 06/01/16 1312 81     Resp 06/01/16 1312 16     Temp 06/01/16 1312 98.4 F (36.9 C)     Temp Source 06/01/16 1312 Oral     SpO2 06/01/16 1312 96 %     Weight --      Height --      Head Circumference --      Peak Flow --      Pain Score 06/01/16 1315 8     Pain Loc --      Pain Edu? --      Excl. in GC? --    No data found.   Updated Vital Signs BP 137/90   Pulse 81   Temp 98.4 F (  36.9 C) (Oral)   Resp 16   SpO2 96%   Visual Acuity Right Eye Distance:   Left Eye Distance:   Bilateral Distance:    Right Eye Near:   Left Eye Near:    Bilateral Near:     Physical Exam  Constitutional: He is oriented to person, place, and time. He appears well-developed and well-nourished. No distress.  Eyes: Conjunctivae and EOM are normal. Pupils are equal, round, and reactive to light.  Neck: Normal range of motion. Neck supple.  Cardiovascular: Normal rate and regular rhythm.   Pulmonary/Chest: Effort normal and breath sounds normal.  Abdominal: Soft. Bowel sounds are normal.  Musculoskeletal: Normal range of motion. He exhibits no edema.  Lymphadenopathy:    He has no cervical adenopathy.  Neurological: He is alert and oriented to person, place, and time. He has normal reflexes. No cranial nerve  deficit. Coordination normal.  Nursing note and vitals reviewed.    UC Treatments / Results  Labs (all labs ordered are listed, but only abnormal results are displayed) Labs Reviewed - No data to display I-stat wnl  EKG  EKG Interpretation None       Radiology No results found.  Procedures Procedures (including critical care time)  Medications Ordered in UC Medications - No data to display   Initial Impression / Assessment and Plan / UC Course  I have reviewed the triage vital signs and the nursing notes.  Pertinent labs & imaging results that were available during my care of the patient were reviewed by me and considered in my medical decision making (see chart for details).    Final Clinical Impressions(s) / UC Diagnoses   Final diagnoses:  None    New Prescriptions New Prescriptions   No medications on file     Linna HoffJames D Sandy Haye, MD 06/01/16 1402

## 2016-06-01 NOTE — ED Triage Notes (Signed)
HA intermittent.  Had resolved, then returned.  Took acetaminophen without relief.

## 2016-08-31 ENCOUNTER — Encounter (HOSPITAL_COMMUNITY): Payer: Self-pay | Admitting: *Deleted

## 2016-08-31 ENCOUNTER — Ambulatory Visit (HOSPITAL_COMMUNITY)
Admission: EM | Admit: 2016-08-31 | Discharge: 2016-08-31 | Disposition: A | Discharge status: Home / Self Care | Payer: BLUE CROSS/BLUE SHIELD | Attending: Family Medicine | Admitting: Family Medicine

## 2016-08-31 DIAGNOSIS — R0789 Other chest pain: Secondary | ICD-10-CM

## 2016-08-31 DIAGNOSIS — M25511 Pain in right shoulder: Secondary | ICD-10-CM

## 2016-08-31 HISTORY — DX: Migraine, unspecified, not intractable, without status migrainosus: G43.909

## 2016-08-31 MED ORDER — TRAMADOL HCL 50 MG PO TABS: 50.0000 mg | ORAL_TABLET | Freq: Four times a day (QID) | ORAL | 0 refills | Status: DC | PRN

## 2016-08-31 NOTE — ED Provider Notes (Signed)
[facility]    CSN: [id number] Arrival date & time: 08/31/16  1429     History   Chief Complaint Chief Complaint  Patient presents with  . Arm Pain    HPI Joshua Spencer is a 35 y.o. male.   The history is provided by the patient. No language interpreter was used.  C/O  right arm 2 days , mostly shoulder area radiating to his sternum. Pain is about 7/10 in severity, aching in nature. Area feels tight as if something is tearing in the area. Denies any trauma. 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 He has been doing a lot of lifting. No swelling. He uses acetaminophen aspirin for headache. It helps some, but he is unable to sleep at night.  Past Medical History:  Diagnosis Date  . Migraines     There are no active problems to display for this patient.   History reviewed. No pertinent surgical history.     Home Medications    Prior to Admission medications   Medication Sig Start Date End Date Taking? Authorizing Provider  SUMAtriptan (IMITREX) 100 MG tablet Take 100 mg by mouth every 2 (two) hours as needed for migraine. May repeat in 2 hours if headache persists or recurs.   Yes Historical Provider, MD  diclofenac sodium (VOLTAREN) 1 % GEL Apply 1 application topically 4 (four) times daily. 06/04/14   Hayden Rasmussenavid Mabe, NP  oseltamivir (TAMIFLU) 75 MG capsule Take 1 capsule (75 mg total) by mouth every 12 (twelve) hours. 12/01/15   Tharon AquasFrank C Patrick, PA  promethazine (PHENERGAN) 25 MG tablet Take 1 tablet (25 mg total) by mouth every 6 (six) hours as needed for nausea or vomiting. 07/17/15   Linna HoffJames D Kindl, MD  traMADol (ULTRAM) 50 MG tablet Take 1 tablet (50 mg total) by mouth every 6 (six) hours as needed. 06/04/14   Hayden Rasmussenavid Mabe, NP  traZODone (DESYREL) 100 MG tablet Take 1 tablet (100 mg total) by mouth at bedtime. 06/01/16   Linna HoffJames D Kindl, MD    Family History No family history on file.  Social History Social History  Substance Use Topics  . Smoking  status: Never Smoker  . Smokeless tobacco: Never Used  . Alcohol use No     Allergies   Patient has no known allergies.   Review of Systems Review of Systems  Respiratory: Negative.   Cardiovascular: Negative.   Musculoskeletal: Positive for arthralgias. Negative for joint swelling.       Right shoulder pain  All other systems reviewed and are negative.    Physical Exam Triage Vital Signs ED Triage Vitals  Enc Vitals Group     BP 08/31/16 1517 126/87     Pulse Rate 08/31/16 1517 73     Resp 08/31/16 1517 12     Temp 08/31/16 1517 98.1 F (36.7 C)     Temp Source 08/31/16 1517 Oral     SpO2 08/31/16 1517 98 %     Weight --      Height --      Head Circumference --      Peak Flow --      Pain Score 08/31/16 1520 7     Pain Loc --      Pain Edu? --      Excl. in GC? --    No data found.   Updated Vital Signs BP 126/87 (BP Location: Left Arm)   Pulse 73   Temp  98.1 F (36.7 C) (Oral)   Resp 12   SpO2 98%   Visual Acuity Right Eye Distance:   Left Eye Distance:   Bilateral Distance:    Right Eye Near:   Left Eye Near:    Bilateral Near:     Physical Exam  Constitutional: He appears well-developed. No distress.  Cardiovascular: Normal rate, regular rhythm, normal heart sounds and intact distal pulses.   No murmur heard. Pulmonary/Chest: Effort normal and breath sounds normal. No respiratory distress. He has no wheezes. He has no rales.  Musculoskeletal:       Right shoulder: He exhibits decreased range of motion and tenderness. He exhibits no swelling and no crepitus.       Left shoulder: Normal.       Arms: Nursing note and vitals reviewed.    UC Treatments / Results  Labs (all labs ordered are listed, but only abnormal results are displayed) Labs Reviewed - No data to display  EKG  EKG Interpretation None       Radiology No results found.  Procedures Procedures (including critical care time)  Medications Ordered in UC Medications  - No data to display   Initial Impression / Assessment and Plan / UC Course  I have reviewed the triage vital signs and the nursing notes.  Pertinent labs & imaging results that were available during my care of the patient were reviewed by me and considered in my medical decision making (see chart for details).  Clinical Course     Patient with right arm and sternal referred pain. Likely due to over use. Patient advised to limit lifting of heavy objects. Tramadol prescribed prn pain. May return to work in 3 days with some weight lifting restriction. Shoulder ROM instruction given. F/U with PCP soon.  Final Clinical Impressions(s) / UC Diagnoses   Final diagnoses:  None   Acute pain of right shoulder  Sternal pain   New Prescriptions New Prescriptions   No medications on file     Doreene ElandKehinde T Eniola, MD 08/31/16 1558

## 2016-08-31 NOTE — Discharge Instructions (Signed)
It was nice seeing you today. It seems your shoulder and chest pain is due to over use. Please limit lifting heavy objects. I have given you some shoulder exercise to help improve your range of motion. Use tramadol as needed for pain.

## 2016-08-31 NOTE — ED Triage Notes (Signed)
C/O constant right shoulder pain x 3 days.  Having difficulty with full ROM due to pain.  C/O pain in chest whenever he tries to move arm above head.  Reports a lot of heavy lifting in job.  Denies any parasthesias.

## 2016-10-24 ENCOUNTER — Encounter (HOSPITAL_COMMUNITY): Payer: Self-pay | Admitting: Emergency Medicine

## 2016-10-24 ENCOUNTER — Ambulatory Visit (HOSPITAL_COMMUNITY)
Admission: EM | Admit: 2016-10-24 | Discharge: 2016-10-24 | Disposition: A | Discharge status: Home / Self Care | Payer: BLUE CROSS/BLUE SHIELD | Attending: Emergency Medicine | Admitting: Emergency Medicine

## 2016-10-24 DIAGNOSIS — R69 Illness, unspecified: Secondary | ICD-10-CM

## 2016-10-24 DIAGNOSIS — J111 Influenza due to unidentified influenza virus with other respiratory manifestations: Secondary | ICD-10-CM

## 2016-10-24 MED ORDER — ONDANSETRON HCL 4 MG PO TABS: 4.0000 mg | ORAL_TABLET | Freq: Three times a day (TID) | ORAL | 0 refills | Status: DC | PRN

## 2016-10-24 MED ORDER — HYDROCODONE-HOMATROPINE 5-1.5 MG/5ML PO SYRP: 5.0000 mL | ORAL_SOLUTION | Freq: Four times a day (QID) | ORAL | 0 refills | Status: DC | PRN

## 2016-10-24 NOTE — ED Provider Notes (Signed)
[facility]    CSN: [id number] Arrival date & time: 10/24/16  1833     History   Chief Complaint Chief Complaint  Patient presents with  . Abdominal Pain  . Cough  . Generalized Body Aches  . Chills    HPI Joshua Spencer is a 36 y.o. male.   HPI  He is a 36 year old man here for possible flu. He states symptoms started on Sunday with hot and cold chills, severe body aches, and cough. He has had some nasal congestion as well. He also describes nausea, and heaving. He has had one episode of vomiting. He is still tolerating liquids. No documented temperature. He did get the flu shot this year. He took some Alka-Seltzer gelcaps last night which did help temporarily.  Past Medical History:  Diagnosis Date  . Migraines     There are no active problems to display for this patient.   History reviewed. No pertinent surgical history.     Home Medications    Prior to Admission medications   Medication Sig Start Date End Date Taking? Authorizing Provider  HYDROcodone-homatropine (HYCODAN) 5-1.5 MG/5ML syrup Take 5 mLs by mouth every 6 (six) hours as needed for cough. 10/24/16   Charm Rings, MD  ondansetron (ZOFRAN) 4 MG tablet Take 1 tablet (4 mg total) by mouth every 8 (eight) hours as needed for nausea or vomiting. 10/24/16   Charm Rings, MD    Family History No family history on file.  Social History Social History  Substance Use Topics  . Smoking status: Never Smoker  . Smokeless tobacco: Never Used  . Alcohol use No     Allergies   Patient has no known allergies.   Review of Systems Review of Systems As in history of present illness  Physical Exam Triage Vital Signs ED Triage Vitals  Enc Vitals Group     BP 10/24/16 1857 112/77     Pulse Rate 10/24/16 1857 87     Resp 10/24/16 1857 16     Temp 10/24/16 1857 99.6 F (37.6 C)     Temp Source 10/24/16 1857 Oral     SpO2 10/24/16 1857 98 %     Weight --      Height --      Head Circumference --        Peak Flow --      Pain Score 10/24/16 1904 6     Pain Loc --      Pain Edu? --      Excl. in GC? --    No data found.   Updated Vital Signs BP 112/77 (BP Location: Left Arm)   Pulse 87   Temp 99.6 F (37.6 C) (Oral)   Resp 16   SpO2 98%   Visual Acuity Right Eye Distance:   Left Eye Distance:   Bilateral Distance:    Right Eye Near:   Left Eye Near:    Bilateral Near:     Physical Exam  Constitutional: He is oriented to person, place, and time. He appears well-developed and well-nourished. No distress.  Appears ill  HENT:  Mouth/Throat: No oropharyngeal exudate.  Minor nasal erythema. Minor oropharyngeal erythema. Left TM is normal. Right ear obscured by earwax.  Neck: Neck supple.  Cardiovascular: Normal rate, regular rhythm and normal heart sounds.   No murmur heard. Pulmonary/Chest: Effort normal and breath sounds normal. No respiratory distress. He has no wheezes. He has no rales.  Lymphadenopathy:  He has no cervical adenopathy.  Neurological: He is alert and oriented to person, place, and time.     UC Treatments / Results  Labs (all labs ordered are listed, but only abnormal results are displayed) Labs Reviewed - No data to display  EKG  EKG Interpretation None       Radiology No results found.  Procedures Procedures (including critical care time)  Medications Ordered in UC Medications - No data to display   Initial Impression / Assessment and Plan / UC Course  I have reviewed the triage vital signs and the nursing notes.  Pertinent labs & imaging results that were available during my care of the patient were reviewed by me and considered in my medical decision making (see chart for details).  Clinical Course     Clinical presentation consistent with flu. He is outside the window for Tamiflu. Symptomatic treatment with Zofran and Hycodan. Recommended Tylenol and ibuprofen for body aches fevers. Expect him to start improving in the  next 2 days, but maybe another week to fully recover. Follow-up as needed.  Final Clinical Impressions(s) / UC Diagnoses   Final diagnoses:  Influenza-like illness    New Prescriptions New Prescriptions   HYDROCODONE-HOMATROPINE (HYCODAN) 5-1.5 MG/5ML SYRUP    Take 5 mLs by mouth every 6 (six) hours as needed for cough.   ONDANSETRON (ZOFRAN) 4 MG TABLET    Take 1 tablet (4 mg total) by mouth every 8 (eight) hours as needed for nausea or vomiting.     Charm RingsErin J Sybella Harnish, MD 10/24/16 [phone]

## 2016-10-24 NOTE — Discharge Instructions (Signed)
You probably have the flu. Get lots of rest and drink lots of fluids. Take Zofran every 8 hours to help with nausea and vomiting. Alternate Tylenol and ibuprofen every 4 hours to help with body aches and fevers. Use the Hycodan every 6 hours as needed for cough and body aches. You should start to feel better by Saturday. If things are not improving or getting worse, please come back.

## 2016-10-24 NOTE — ED Triage Notes (Signed)
Pt. Stated, I've had flu-like symptoms but Ive had the flu shot.  I've had cough, stomach pain, body aches, chills for a couple days.

## 2016-12-09 ENCOUNTER — Ambulatory Visit: Payer: BLUE CROSS/BLUE SHIELD | Admitting: Neurology

## 2017-01-13 ENCOUNTER — Ambulatory Visit (HOSPITAL_COMMUNITY)
Admission: EM | Admit: 2017-01-13 | Discharge: 2017-01-13 | Disposition: A | Discharge status: Home / Self Care | Payer: BLUE CROSS/BLUE SHIELD | Attending: Internal Medicine | Admitting: Internal Medicine

## 2017-01-13 ENCOUNTER — Encounter (HOSPITAL_COMMUNITY): Payer: Self-pay | Admitting: Emergency Medicine

## 2017-01-13 DIAGNOSIS — R51 Headache: Secondary | ICD-10-CM

## 2017-01-13 DIAGNOSIS — R42 Dizziness and giddiness: Secondary | ICD-10-CM

## 2017-01-13 DIAGNOSIS — R519 Headache, unspecified: Secondary | ICD-10-CM

## 2017-01-13 MED ORDER — METOCLOPRAMIDE HCL 5 MG/ML IJ SOLN: 5.0000 mg | Freq: Once | INTRAMUSCULAR | Status: DC

## 2017-01-13 MED ORDER — MECLIZINE HCL 25 MG PO TABS: 25.0000 mg | ORAL_TABLET | Freq: Three times a day (TID) | ORAL | 0 refills | Status: DC | PRN

## 2017-01-13 MED ORDER — DEXAMETHASONE SODIUM PHOSPHATE 10 MG/ML IJ SOLN: 10.0000 mg | Freq: Once | INTRAMUSCULAR | Status: DC

## 2017-01-13 MED ORDER — KETOROLAC TROMETHAMINE 60 MG/2ML IM SOLN: 60.0000 mg | Freq: Once | INTRAMUSCULAR | Status: DC

## 2017-01-13 NOTE — ED Provider Notes (Signed)
CSN: [id number]     Arrival date & time 01/13/17  1241 History   First MD Initiated Contact with Patient 01/13/17 1403     Chief Complaint  Patient presents with  . Headache   (Consider location/radiation/quality/duration/timing/severity/associated sxs/prior Treatment) HPI Joshua Spencer is a 36 y.o. male presenting to UC with c/o 3 days of gradually worsening frontal headache that radiates to the back of his head.  Associated nausea, photophobia and intermittent dizziness. Pt notes he has been having headaches since he was 36yo.  Recently the frequency of headaches has worsened so he is scheduled for a neurology consult in April.  He has tried OTC medications, acetaminophen, ibuprofen, and Excedrin w/o relief. Denies recent head injuries. Pt notes he was told at one time stress could be the cause of his headaches. No other known triggers.    Past Medical History:  Diagnosis Date  . Migraines    History reviewed. No pertinent surgical history. History reviewed. No pertinent family history. Social History  Substance Use Topics  . Smoking status: Never Smoker  . Smokeless tobacco: Never Used  . Alcohol use No    Review of Systems  Constitutional: Negative for chills and fever.  HENT: Negative for congestion, ear pain, sore throat, trouble swallowing and voice change.   Eyes: Positive for photophobia. Negative for visual disturbance.  Respiratory: Negative for cough and shortness of breath.   Cardiovascular: Negative for chest pain and palpitations.  Gastrointestinal: Positive for nausea. Negative for abdominal pain, diarrhea and vomiting.  Musculoskeletal: Positive for myalgias. Negative for arthralgias, back pain, neck pain and neck stiffness.  Skin: Negative for rash.  Neurological: Positive for dizziness and headaches. Negative for syncope, weakness and light-headedness.    Allergies  Patient has no known allergies.  Home Medications   Prior to Admission medications    Medication Sig Start Date End Date Taking? Authorizing Provider  meclizine (ANTIVERT) 25 MG tablet Take 1 tablet (25 mg total) by mouth 3 (three) times daily as needed for dizziness or nausea. 01/13/17   Junius Finner, PA-C   Meds Ordered and Administered this Visit   Medications - No data to display  BP (!) 149/88 (BP Location: Right Arm)   Pulse 75   Temp 98.4 F (36.9 C) (Oral)   Resp 18   SpO2 98%  No data found.   Physical Exam  Constitutional: He is oriented to person, place, and time. He appears well-developed and well-nourished. No distress.  HENT:  Head: Normocephalic and atraumatic.  Right Ear: Tympanic membrane normal.  Left Ear: Tympanic membrane normal.  Nose: Nose normal.  Mouth/Throat: Uvula is midline, oropharynx is clear and moist and mucous membranes are normal.  Eyes: EOM are normal. Pupils are equal, round, and reactive to light. Right eye exhibits no discharge. Left eye exhibits no discharge.  Neck: Normal range of motion. Neck supple.  No nuchal rigidity or meningeal signs.  Cardiovascular: Normal rate and regular rhythm.   Pulmonary/Chest: Effort normal and breath sounds normal. No respiratory distress. He has no wheezes. He has no rales.  Musculoskeletal: Normal range of motion.  Neurological: He is alert and oriented to person, place, and time.  CN II-XII in tact. Speech is clear. Alert to person, place, and time.   Skin: Skin is warm and dry. He is not diaphoretic.  Psychiatric: He has a normal mood and affect. His behavior is normal.  Nursing note and vitals reviewed.   Urgent Care Course     Procedures (including  critical care time)  Labs Review Labs Reviewed - No data to display  Imaging Review No results found.   MDM   1. Recurrent headache   2. Dizziness     Pt c/o HA with dizziness, nausea, and photophobia.    Offered migraine cocktail of Toradol, Reglan, and Decadron. Pt initially agreed, then declined stating he was more  concerned about the dizziness.   Pt requests to skip HA treatment and be discharged home with meclizine to try.  Rx: Meclizine  Encouraged f/u with PCP for recurrent headaches and dizziness. Discussed symptoms that warrant emergent care in the ED.    Junius Finnerrin O'Malley, PA-C 01/13/17 1431

## 2017-01-13 NOTE — Discharge Instructions (Signed)
°  Antivert (meclizine) is a medication to help with dizziness and nausea related to vertigo.  This medication can cause drowsiness. Do not operate heavy machinery or drive while taking.  ° °

## 2017-01-13 NOTE — ED Triage Notes (Signed)
[facility] with a complaint of a headache with nausea and dizziness that started 3 days ago and got worse yesterday.

## 2017-01-23 ENCOUNTER — Emergency Department (HOSPITAL_COMMUNITY): Payer: BLUE CROSS/BLUE SHIELD

## 2017-01-23 ENCOUNTER — Emergency Department (HOSPITAL_COMMUNITY)
Admission: EM | Admit: 2017-01-23 | Discharge: 2017-01-23 | Disposition: A | Discharge status: Home / Self Care | Payer: BLUE CROSS/BLUE SHIELD | Attending: Emergency Medicine | Admitting: Emergency Medicine

## 2017-01-23 ENCOUNTER — Encounter (HOSPITAL_COMMUNITY): Payer: Self-pay | Admitting: *Deleted

## 2017-01-23 DIAGNOSIS — R51 Headache: Secondary | ICD-10-CM | POA: Diagnosis not present

## 2017-01-23 DIAGNOSIS — R42 Dizziness and giddiness: Secondary | ICD-10-CM

## 2017-01-23 DIAGNOSIS — R0789 Other chest pain: Secondary | ICD-10-CM | POA: Diagnosis not present

## 2017-01-23 LAB — CBC
HCT: 44.5 % (ref 39.0–52.0)
Hemoglobin: 14.6 g/dL (ref 13.0–17.0)
MCH: 29.4 pg (ref 26.0–34.0)
MCHC: 32.8 g/dL (ref 30.0–36.0)
MCV: 89.5 fL (ref 78.0–100.0)
PLATELETS: 303 10*3/uL (ref 150–400)
RBC: 4.97 MIL/uL (ref 4.22–5.81)
RDW: 12.4 % (ref 11.5–15.5)
WBC: 4.3 10*3/uL (ref 4.0–10.5)

## 2017-01-23 LAB — BASIC METABOLIC PANEL
Anion gap: 6 (ref 5–15)
BUN: 9 mg/dL (ref 6–20)
CALCIUM: 8.9 mg/dL (ref 8.9–10.3)
CHLORIDE: 104 mmol/L (ref 101–111)
CO2: 26 mmol/L (ref 22–32)
CREATININE: 1.02 mg/dL (ref 0.61–1.24)
GFR calc Af Amer: >60 mL/min (ref >60)
Glucose, Bld: 113 mg/dL — ABNORMAL HIGH (ref 65–99)
Potassium: 3.8 mmol/L (ref 3.5–5.1)
SODIUM: 136 mmol/L (ref 135–145)

## 2017-01-23 LAB — I-STAT TROPONIN, ED: TROPONIN I, POC: 0 ng/mL (ref 0.00–0.08)

## 2017-01-23 MED ORDER — ACETAMINOPHEN 500 MG PO TABS
1000.0000 mg | ORAL_TABLET | Freq: Once | ORAL | Status: AC
  Administered 2017-01-23: 1000 mg via ORAL
  Filled 2017-01-23: qty 2

## 2017-01-23 NOTE — ED Triage Notes (Signed)
PT is here with headaches and getting dizzy spells with nausea. Pt reports chest pain and sob recently. Pt states continues to have headache and denies blood thinners.

## 2017-01-23 NOTE — ED Notes (Signed)
Pt states was seen at [facility] for h/a and blurry vision states was told to come back if it did not resolve  So he is back

## 2017-01-23 NOTE — Discharge Instructions (Signed)
No serious cause of your vertigo or headaches from today's testing and exam. You can continue to take meclizine(Antivert) as needed for dizziness. Take Tylenol as needed for pain. Contact Dr.Garba to arrange to be seen in his office if you continue to have dizziness by next week . Return if concern for any reason

## 2017-01-23 NOTE — ED Provider Notes (Signed)
[facility] Note   CSN: [id number] Arrival date & time: 01/23/17  1401   By signing my name below, I, Soijett Blue, attest that this documentation has been prepared under the direction and in the presence of No att. providers found. Electronically Signed: Soijett Blue, ED Scribe. 01/23/17. 3:10 PM.  History   Chief Complaint Chief Complaint  Patient presents with  . Headache  . Dizziness  . Chest Pain    HPI Joshua Spencer is a 36 y.o. male with a, who presents to the Emergency Department complainingOf intermittent headaches for the past 4 months, worsening 4 days ago. Pt reports associated intermittent dizziness x room spinning sensation , n approximately the past 10 days ausea, and sternal CP x 5 days ago. he only has chest pain when he stretches his arms. Chest Pain is nonexertional. No shortness of breath, no sweatiness. He states it feels like a pulled muscle  Pt has tried 25 mg meclizine TID with with relief of his dizziness. he isn't dizzy now. He reports that he first experienced dizziness 4 months ago when he had the flu. Pt sternal CP is worsened with stretching and alleviated with rest. He denies tinnitus, vomiting, vision change, SOB, and any other associated symptoms. Denies prior surgeries, smoking cigarettes, ETOH use, or illegal drug use. Pt PCP is Dr. Lonia Blood. no visual changes no focal numbness or weakness no speech abnormality. No other associated symptoms. He is treated himself with Tylenol with relief of headache  Per pt chart review: Pt was seen in the Urgent Care on 01/13/2017 for headache. Pt was offered a migraine cocktail of toradol, reglan, and decadron, to which the pt declined. Pt was Rx meclizine for their symptoms.   The history is provided by the patient. No language interpreter was used.    Past Medical History:  Diagnosis Date  . Migraines     There are no active problems to display for this patient.   History reviewed. No pertinent  surgical history.     Home Medications    Prior to Admission medications   Medication Sig Start Date End Date Taking? Authorizing Provider  meclizine (ANTIVERT) 25 MG tablet Take 1 tablet (25 mg total) by mouth 3 (three) times daily as needed for dizziness or nausea. 01/13/17   Junius Finner, PA-C    Family History No family history on file. No family history of heart disease Social History Social History  Substance Use Topics  . Smoking status: Never Smoker  . Smokeless tobacco: Never Used  . Alcohol use No     Allergies   Patient has no known allergies.   Review of Systems Review of Systems  Constitutional: Negative.   HENT: Negative.  Negative for tinnitus.   Eyes: Negative for visual disturbance.  Respiratory: Negative.  Negative for shortness of breath.   Cardiovascular: Positive for chest pain.  Gastrointestinal: Positive for nausea. Negative for vomiting.  Musculoskeletal: Negative.   Skin: Negative.   Neurological: Positive for dizziness and headaches.  Psychiatric/Behavioral: Negative.   All other systems reviewed and are negative.    Physical Exam Updated Vital Signs BP 118/85 (BP Location: Left Arm)   Pulse 85   Temp 98.1 F (36.7 C) (Oral)   Resp 18   SpO2 98%   Physical Exam  Constitutional: He is oriented to person, place, and time. He appears well-developed and well-nourished.  HENT:  Head: Normocephalic and atraumatic.  Right Ear: Tympanic membrane, external ear and ear canal normal.  Left Ear: Tympanic membrane, external ear and ear canal normal.  Eyes: Conjunctivae are normal. Pupils are equal, round, and reactive to light.  Neck: Neck supple. No tracheal deviation present. No thyromegaly present.  Cardiovascular: Normal rate, regular rhythm and normal heart sounds.  Exam reveals no gallop and no friction rub.   No murmur heard. Pulmonary/Chest: Effort normal and breath sounds normal. No respiratory distress. He has no wheezes. He has no  rales.  Chest is tender anteriorly, reproducing pain exactly.   Abdominal: He exhibits no distension.  Musculoskeletal: Normal range of motion. He exhibits no edema or tenderness.  Neurological: He is alert and oriented to person, place, and time. He has normal strength. Coordination and gait normal.  Steady gait. Negative pronator drift. Nl finger-to-nose test. Romberg normal. Motor strength 5 over 5 overall  Skin: Skin is warm and dry. No rash noted.  Psychiatric: He has a normal mood and affect.  Nursing note and vitals reviewed.    ED Treatments / Results  DIAGNOSTIC STUDIES: Oxygen Saturation is 98% on RA, nl by my interpretation.    COORDINATION OF CARE: 3:09 PM Discussed treatment plan with pt at bedside which includes labs, EKG, CXR, CT head, and pt agreed to plan.   Labs (all labs ordered are listed, but only abnormal results are displayed) Labs Reviewed  BASIC METABOLIC PANEL  CBC  I-STAT TROPOININ, ED    EKG  EKG Interpretation  Date/Time:  Thursday January 23 2017 14:19:18 EDT Ventricular Rate:  75 PR Interval:  160 QRS Duration: 76 QT Interval:  366 QTC Calculation: 408 R Axis:   79 Text Interpretation:  Sinus rhythm with occasional Premature ventricular complexes Nonspecific T wave abnormality Abnormal ECG No old tracing to compare Confirmed by Ethelda Chick  MD, Merrilee Ancona [phone]) on 01/23/2017 2:42:46 PM      Chest x-ray viewed by me. Results for orders placed or performed during the hospital encounter of 01/23/17  Basic metabolic panel  Result Value Ref Range   Sodium 136 135 - 145 mmol/L   Potassium 3.8 3.5 - 5.1 mmol/L   Chloride 104 101 - 111 mmol/L   CO2 26 22 - 32 mmol/L   Glucose, Bld 113 (H) 65 - 99 mg/dL   BUN 9 6 - 20 mg/dL   Creatinine, Ser 6.04 0.61 - 1.24 mg/dL   Calcium 8.9 8.9 - 54.0 mg/dL   GFR calc non Af Amer >60 >60 mL/min   GFR calc Af Amer >60 >60 mL/min   Anion gap 6 5 - 15  CBC  Result Value Ref Range   WBC 4.3 4.0 - 10.5 K/uL   RBC  4.97 4.22 - 5.81 MIL/uL   Hemoglobin 14.6 13.0 - 17.0 g/dL   HCT 98.1 19.1 - 47.8 %   MCV 89.5 78.0 - 100.0 fL   MCH 29.4 26.0 - 34.0 pg   MCHC 32.8 30.0 - 36.0 g/dL   RDW 29.5 62.1 - 30.8 %   Platelets 303 150 - 400 K/uL  I-stat troponin, ED  Result Value Ref Range   Troponin i, poc 0.00 0.00 - 0.08 ng/mL   Comment 3           Dg Chest 2 View  Result Date: 01/23/2017 CLINICAL DATA:  Chest pain.  Headache and dizziness. EXAM: CHEST  2 VIEW COMPARISON:  11/24/2005 FINDINGS: The heart size and mediastinal contours are within normal limits. Both lungs are clear. No pleural effusion or pneumothorax. The visualized skeletal structures are unremarkable. IMPRESSION:  No active cardiopulmonary disease. Electronically Signed   By: Amie Portland M.D.   On: 01/23/2017 14:43   Ct Head Wo Contrast  Result Date: 01/23/2017 CLINICAL DATA:  Headaches with nausea EXAM: CT HEAD WITHOUT CONTRAST TECHNIQUE: Contiguous axial images were obtained from the base of the skull through the vertex without intravenous contrast. COMPARISON:  None. FINDINGS: Brain: No evidence of acute infarction, hemorrhage, hydrocephalus, extra-axial collection or mass lesion/mass effect. Vascular: No hyperdense vessel or unexpected calcification. Skull: Normal. Negative for fracture or focal lesion. Sinuses/Orbits: Mucosal thickening in the ethmoid sinuses. Mucous retention cysts in the maxillary sinuses. No acute orbital abnormality. Other: Diffuse fatty infiltration of the parotid glands. Mild diffuse skin thickening and mild subcutaneous haziness/edema. IMPRESSION: No CT evidence for acute intracranial abnormality. Electronically Signed   By: Jasmine Pang M.D.   On: 01/23/2017 16:48   Radiology No results found.  Procedures Procedures (including critical care time)  Medications Ordered in ED Medications - No data to display Initial Impression / Assessment and Plan / ED Course  I have reviewed the triage vital signs and the  nursing notes. 5:05 PM headache improved after treatment with Tylenol. He is alert and auditory in no distress Pertinent labs & imaging results that were available during my care of the patient were reviewed by me and considered in my medical decision making (see chart for details).   plan Tylenol for pain. He can continue to take meclizine as needed for vertigo. Follow up with primary care physician as needed Strongly doubt cardiac etiology of symptoms. Heart score equals 1. Exam and history is consistent with chest wall pain Vertigo likely peripheral in etiology. CT scan of head ordered and is headaches are chronic and has not had prior imaging Final Clinical Impressions(s) / ED Diagnoses  Diagnosis #1 vertigo #2 nonspecific headache #3 chest wall pain Final diagnoses:  None    New Prescriptions New Prescriptions   No medications on file   I personally performed the services described in this documentation, which was scribed in my presence. The recorded information has been reviewed and considered.     Doug Sou, MD 01/23/17 [phone]

## 2017-02-06 ENCOUNTER — Ambulatory Visit (INDEPENDENT_AMBULATORY_CARE_PROVIDER_SITE_OTHER): Payer: BLUE CROSS/BLUE SHIELD | Admitting: Neurology

## 2017-02-06 ENCOUNTER — Encounter: Payer: Self-pay | Admitting: Neurology

## 2017-02-06 VITALS — BP 110/80 | HR 80 | Ht 66.0 in | Wt 190.0 lb

## 2017-02-06 DIAGNOSIS — G44229 Chronic tension-type headache, not intractable: Secondary | ICD-10-CM

## 2017-02-06 DIAGNOSIS — F419 Anxiety disorder, unspecified: Secondary | ICD-10-CM

## 2017-02-06 MED ORDER — NORTRIPTYLINE HCL 25 MG PO CAPS: 25.0000 mg | ORAL_CAPSULE | Freq: Every day | ORAL | 3 refills | Status: DC

## 2017-02-06 NOTE — Progress Notes (Signed)
NEUROLOGY CONSULTATION NOTE  Joshua Spencer MRN: [id number] DOB: [birth year]  Referring provider: Dr. Mikeal Hawthorne Primary care provider: Dr. Mikeal Hawthorne  Reason for consult:  headache  HISTORY OF PRESENT ILLNESS: Joshua Spencer is a 36 year old male with asthma and depression who presents for headache.  He is accompanied by his mother who supplements history.  History supplemented by PCP note.  Onset:  He has had these headaches off and on since age 35.  Current headaches have been ongoing for about a month. Location:  Top of head and temples Quality:  squeezing Intensity:  9/10 Aura:  no Prodrome:  no Associated symptoms:  Sometimes nausea.  No photophobia, phonophobia or visual disturbance.  He has not had any new worse headache of her life, waking up from sleep Duration:  Up to 20 minutes  Frequency:  About 6 times a day Frequency of abortive medication: acetaminophen daily Triggers/exacerbating factors:  unknown Relieving factors:  rest Activity:  functions  Past NSAIDS:  Advil Past analgesics:  Tylenol Past abortive triptans:  Sumatriptan  (ineffective) Past muscle relaxants:  no Past anti-emetic:  no Past antihypertensive medications:  no Past antidepressant medications:  no Past anticonvulsant medications:  no Past vitamins/Herbal/Supplements:  no Other past therapies:  no  Current NSAIDS:  no Current analgesics:  acetaminophen Current triptans:  no Current anti-emetic:  no Current muscle relaxants:  no Current anti-anxiolytic:  no Current sleep aide:  no Current Antihypertensive medications:  no Current Antidepressant medications:  no Current Anticonvulsant medications:  no Current Vitamins/Herbal/Supplements:  no Current Antihistamines/Decongestants:  no Other therapy:  no Other medication:  Meclizine for recent episode of vertigo  Caffeine:  no Alcohol:  no Smoker:  no Diet:  Drinks water and juice Exercise:  no Depression/anxiety:  History of depression.   Reports significant work-related stress Sleep hygiene:  poor Family history of headache:  No  CT of head from 01/23/17 was personally reviewed and is normal.  PAST MEDICAL HISTORY: Past Medical History:  Diagnosis Date  . Migraines     PAST SURGICAL HISTORY: History reviewed. No pertinent surgical history.  MEDICATIONS: Current Outpatient Prescriptions on File Prior to Visit  Medication Sig Dispense Refill  . meclizine (ANTIVERT) 25 MG tablet Take 1 tablet (25 mg total) by mouth 3 (three) times daily as needed for dizziness or nausea. 30 tablet 0   No current facility-administered medications on file prior to visit.     ALLERGIES: No Known Allergies  FAMILY HISTORY: History reviewed. No pertinent family history.  SOCIAL HISTORY: Social History   Social History  . Marital status: Single    Spouse name: N/A  . Number of children: 0  . Years of education: N/A   Occupational History  . electronic associate [employer]   Social History Main Topics  . Smoking status: Never Smoker  . Smokeless tobacco: Never Used  . Alcohol use No  . Drug use: No  . Sexual activity: Not on file   Other Topics Concern  . Not on file   Social History Narrative   Lives with mom in a one story home.  No children.  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      REVIEW OF SYSTEMS: Constitutional: No fevers, chills, or sweats, no generalized fatigue, change in appetite Eyes: No visual changes, double vision, eye pain Ear, nose and throat: No hearing loss, ear pain, nasal congestion, sore throat Cardiovascular: No chest pain, palpitations Respiratory:  No shortness of breath at  rest or with exertion, wheezes GastrointestinaI: No nausea, vomiting, diarrhea, abdominal pain, fecal incontinence Genitourinary:  No dysuria, urinary retention or frequency Musculoskeletal:  No neck pain, back pain Integumentary: No rash, pruritus, skin lesions Neurological: as  above Psychiatric: No depression, insomnia, anxiety Endocrine: No palpitations, fatigue, diaphoresis, mood swings, change in appetite, change in weight, increased thirst Hematologic/Lymphatic:  No purpura, petechiae. Allergic/Immunologic: no itchy/runny eyes, nasal congestion, recent allergic reactions, rashes  PHYSICAL EXAM: Vitals:   02/06/17 0954  BP: 110/80  Pulse: 80   General: No acute distress.  Appears nervous with nervous laughter.  Patient appears well-groomed.  Head:  Normocephalic/atraumatic Eyes:  fundi examined but not visualized Neck: supple, no paraspinal tenderness, full range of motion Back: No paraspinal tenderness Heart: regular rate and rhythm Lungs: Clear to auscultation bilaterally. Vascular: No carotid bruits. Neurological Exam: Mental status: alert and oriented to person, place, and time, recent and remote memory intact, fund of knowledge intact, attention and concentration intact, speech fluent and not dysarthric, language intact. Cranial nerves: CN I: not tested CN II: pupils equal, round and reactive to light, visual fields intact CN III, IV, VI:  full range of motion, no nystagmus, no ptosis CN V: facial sensation intact CN VII: upper and lower face symmetric CN VIII: hearing intact CN IX, X: gag intact, uvula midline CN XI: sternocleidomastoid and trapezius muscles intact CN XII: tongue midline Bulk & Tone: normal, no fasciculations. Motor:  5/5 throughout  Sensation: temperature and vibration sensation intact. Deep Tendon Reflexes:  2+ throughout, toes downgoing.  Finger to nose testing:  Without dysmetria.  Heel to shin:  Without dysmetria.  Gait:  Normal station and stride.  Able to turn and tandem walk. Romberg negative.  IMPRESSION: Chronic tension type headaches, possibly secondary to stress and anxiety  PLAN: 1.  Start nortriptyline  at bedtime.  Contact us in 4 weeks with update and we can increase dose if needed. 2.  Stop  acetaminophen.  Use ibuprofen as needed, limited to no more than 2 days out of the week 3.  Lifestyle modification:  Sleep hygiene, stress management, diet, exercise 4.  Consider supplements:  Magnesium, CoQ10, riboflavin 5.  Follow up in 3 months  Thank you for allowing me to take part in the care of this patient.  Shon Millet, DO  CC:  Earlie Lou, MD

## 2017-02-06 NOTE — Patient Instructions (Signed)
  1.  Start nortriptyline  at bedtime.  Call in 4 weeks with update and we can adjust dose if needed. 2.  Stop acetaminophen.  Instead take ibuprofen.  Limit use to no more than 2 days out of the week. 3.  Be aware of common food triggers such as processed sweets, processed foods with nitrites (such as deli meat, hot dogs, sausages), foods with MSG, alcohol (such as wine), chocolate, certain cheeses, certain fruits (dried fruits, some citrus fruit), vinegar, diet soda. 4.  Avoid caffeine 5.  Routine exercise 6.  Proper sleep hygiene 7.  Stay adequately hydrated with water 8.  Keep a headache diary. 9.  Maintain proper stress management. 10.  Do not skip meals. 11.  Consider supplements:  Magnesium citrate  to  daily, riboflavin , Coenzyme Q 10  three times daily 12.  Follow up in 3 months but contact us in 4 weeks before refill to see if we need to increase dose of nortriptyline.

## 2017-03-04 ENCOUNTER — Telehealth: Payer: Self-pay | Admitting: Neurology

## 2017-03-04 ENCOUNTER — Other Ambulatory Visit: Payer: Self-pay | Admitting: *Deleted

## 2017-03-04 MED ORDER — NORTRIPTYLINE HCL 50 MG PO CAPS: 50.0000 mg | ORAL_CAPSULE | Freq: Every day | ORAL | 5 refills | Status: DC

## 2017-03-04 NOTE — Telephone Encounter (Signed)
Pt called and stated his med is not working for him any longer - nortriptyline. Pain has increased.

## 2017-03-04 NOTE — Telephone Encounter (Signed)
Patient given instructions and new Rx sent.   

## 2017-03-04 NOTE — Telephone Encounter (Signed)
Please advise 

## 2017-03-04 NOTE — Telephone Encounter (Signed)
Increase nortriptyline to 50mg  at bedtime.  He should contact us in 4 weeks with another update.

## 2017-04-29 ENCOUNTER — Ambulatory Visit (HOSPITAL_COMMUNITY)
Admission: EM | Admit: 2017-04-29 | Discharge: 2017-04-29 | Disposition: A | Discharge status: Home / Self Care | Payer: BLUE CROSS/BLUE SHIELD | Attending: Emergency Medicine | Admitting: Emergency Medicine

## 2017-04-29 ENCOUNTER — Encounter (HOSPITAL_COMMUNITY): Payer: Self-pay | Admitting: Emergency Medicine

## 2017-04-29 DIAGNOSIS — G43709 Chronic migraine without aura, not intractable, without status migrainosus: Secondary | ICD-10-CM

## 2017-04-29 MED ORDER — DICLOFENAC SODIUM 75 MG PO TBEC: 75.0000 mg | DELAYED_RELEASE_TABLET | Freq: Two times a day (BID) | ORAL | 0 refills | Status: DC

## 2017-04-29 MED ORDER — DEXAMETHASONE SODIUM PHOSPHATE 10 MG/ML IJ SOLN
INTRAMUSCULAR | Status: AC
  Filled 2017-04-29: qty 1

## 2017-04-29 MED ORDER — KETOROLAC TROMETHAMINE 30 MG/ML IJ SOLN
INTRAMUSCULAR | Status: AC
  Filled 2017-04-29: qty 1

## 2017-04-29 MED ORDER — DEXAMETHASONE SODIUM PHOSPHATE 10 MG/ML IJ SOLN
10.0000 mg | Freq: Once | INTRAMUSCULAR | Status: AC
  Administered 2017-04-29: 10 mg via INTRAMUSCULAR

## 2017-04-29 MED ORDER — KETOROLAC TROMETHAMINE 30 MG/ML IJ SOLN
30.0000 mg | Freq: Once | INTRAMUSCULAR | Status: AC
  Administered 2017-04-29: 30 mg via INTRAMUSCULAR

## 2017-04-29 NOTE — Discharge Instructions (Signed)
For your migraine I have prescribed diclofenac, take one tablet twice a day as needed for pain. If your pain persists, follow up with your PCP or your neurologist as needed. If your symptoms worsen, consider the ER.

## 2017-04-29 NOTE — ED Provider Notes (Signed)
CSN: [id number]     Arrival date & time 04/29/17  1631 History   None    Chief Complaint  Patient presents with  . Headache   (Consider location/radiation/quality/duration/timing/severity/associated sxs/prior Treatment) The history is provided by the patient.  Headache  Pain location:  Generalized Quality:  Dull Radiates to:  Does not radiate Severity currently:  8/10 Severity at highest:  10/10 Onset quality:  Gradual Duration:  1 week Timing:  Intermittent Progression:  Unchanged Chronicity:  Recurrent Similar to prior headaches: yes   Context: not activity, not exposure to bright light, not caffeine, not defecating and not loud noise   Relieved by:  NSAIDs, prescription medications, resting in a darkened room and acetaminophen Associated symptoms: no abdominal pain, no blurred vision, no drainage, no ear pain, no eye pain, no focal weakness, no myalgias, no nausea, no near-syncope, no neck stiffness, no photophobia, no sinus pressure, no sore throat, no syncope, no visual change and no vomiting     Past Medical History:  Diagnosis Date  . Migraines    History reviewed. No pertinent surgical history. No family history on file. Social History  Substance Use Topics  . Smoking status: Never Smoker  . Smokeless tobacco: Never Used  . Alcohol use No    Review of Systems  Constitutional: Negative.   HENT: Negative for ear pain, postnasal drip, sinus pain, sinus pressure and sore throat.   Eyes: Negative for blurred vision, photophobia and pain.  Respiratory: Negative.   Cardiovascular: Negative.  Negative for syncope and near-syncope.  Gastrointestinal: Negative for abdominal pain, nausea and vomiting.  Musculoskeletal: Negative for myalgias and neck stiffness.  Skin: Negative.   Neurological: Positive for headaches. Negative for focal weakness and light-headedness.  Hematological: Negative.     Allergies  Patient has no known allergies.  Home Medications   Prior  to Admission medications   Medication Sig Start Date End Date Taking? Authorizing Provider  diclofenac (VOLTAREN) 75 MG EC tablet Take 1 tablet (75 mg total) by mouth 2 (two) times daily. 04/29/17   Dorena Bodo, NP  meclizine (ANTIVERT) 25 MG tablet Take 1 tablet (25 mg total) by mouth 3 (three) times daily as needed for dizziness or nausea. 01/13/17   Lurene Shadow, PA-C  nortriptyline (PAMELOR) 25 MG capsule Take 1 capsule (25 mg total) by mouth at bedtime. 02/06/17   Drema Dallas, DO  nortriptyline (PAMELOR) 50 MG capsule Take 1 capsule (50 mg total) by mouth at bedtime. 03/04/17   Drema Dallas, DO   Meds Ordered and Administered this Visit   Medications  ketorolac (TORADOL) 30 MG/ML injection 30 mg (30 mg Intramuscular Given 04/29/17 1755)  dexamethasone (DECADRON) injection 10 mg (10 mg Intramuscular Given 04/29/17 1753)    BP 122/88 (BP Location: Right Arm)   Pulse 75   Temp 98.3 F (36.8 C) (Oral)   Resp 16   SpO2 98%  No data found.   Physical Exam  Constitutional: He is oriented to person, place, and time. He appears well-developed and well-nourished. No distress.  HENT:  Head: Normocephalic and atraumatic.  Right Ear: External ear normal.  Left Ear: External ear normal.  Eyes: Conjunctivae and EOM are normal. Pupils are equal, round, and reactive to light.  Neck: Normal range of motion.  Cardiovascular: Normal rate and regular rhythm.   Pulmonary/Chest: Effort normal and breath sounds normal.  Neurological: He is alert and oriented to person, place, and time. No cranial nerve deficit. He exhibits normal muscle  tone. Coordination normal.  Skin: Skin is warm and dry. Capillary refill takes less than 2 seconds. He is not diaphoretic.  Psychiatric: He has a normal mood and affect. His behavior is normal.  Nursing note and vitals reviewed.   Urgent Care Course     Procedures (including critical care time)  Labs Review Labs Reviewed - No data to display  Imaging  Review No results found.    MDM   1. Chronic migraine without aura without status migrainosus, not intractable     Given injection of Toradol and Decadron, started on diclofenac, follow-up with his neurologist if pain persists    Dorena BodoKennard, Mercedes Fort, NP 04/29/17 2128

## 2017-04-29 NOTE — ED Triage Notes (Signed)
Patient has a headache for 2 days, severity varies.  No cough or cold or congestion

## 2017-05-09 ENCOUNTER — Ambulatory Visit: Payer: BLUE CROSS/BLUE SHIELD | Admitting: Neurology

## 2017-05-13 ENCOUNTER — Ambulatory Visit: Payer: BLUE CROSS/BLUE SHIELD | Admitting: Neurology

## 2017-05-13 DIAGNOSIS — Z029 Encounter for administrative examinations, unspecified: Secondary | ICD-10-CM

## 2017-05-16 ENCOUNTER — Encounter: Payer: Self-pay | Admitting: Neurology

## 2017-07-18 ENCOUNTER — Telehealth: Payer: Self-pay | Admitting: Neurology

## 2017-07-18 NOTE — Telephone Encounter (Signed)
LM for Pt to rtrn call

## 2017-07-18 NOTE — Telephone Encounter (Signed)
Pt called and said the dizziness and headaches are getting worse and would like a call back to please advise

## 2017-07-23 NOTE — Telephone Encounter (Signed)
Attempted to reach Pt again, LM on VM again asking if he was still experiencing symptoms and to rtrn call

## 2017-07-28 NOTE — Telephone Encounter (Signed)
LM for Pt, advising this will be my last attempt to reach him, and that hopefully his symptoms had improved

## 2017-08-19 ENCOUNTER — Ambulatory Visit (HOSPITAL_COMMUNITY)
Admission: EM | Admit: 2017-08-19 | Discharge: 2017-08-19 | Disposition: A | Discharge status: Home / Self Care | Payer: BLUE CROSS/BLUE SHIELD | Attending: Family Medicine | Admitting: Family Medicine

## 2017-08-19 ENCOUNTER — Encounter (HOSPITAL_COMMUNITY): Payer: Self-pay | Admitting: Emergency Medicine

## 2017-08-19 DIAGNOSIS — R51 Headache: Secondary | ICD-10-CM | POA: Diagnosis not present

## 2017-08-19 DIAGNOSIS — R519 Headache, unspecified: Secondary | ICD-10-CM

## 2017-08-19 MED ORDER — DEXAMETHASONE SODIUM PHOSPHATE 10 MG/ML IJ SOLN
INTRAMUSCULAR | Status: AC
Start: 2017-08-19 — End: ?
  Filled 2017-08-19: qty 1

## 2017-08-19 MED ORDER — ONDANSETRON 4 MG PO TBDP: 4.0000 mg | ORAL_TABLET | Freq: Three times a day (TID) | ORAL | 0 refills | Status: DC | PRN

## 2017-08-19 MED ORDER — KETOROLAC TROMETHAMINE 30 MG/ML IJ SOLN
INTRAMUSCULAR | Status: AC
  Filled 2017-08-19: qty 1

## 2017-08-19 MED ORDER — DEXAMETHASONE SODIUM PHOSPHATE 10 MG/ML IJ SOLN
10.0000 mg | Freq: Once | INTRAMUSCULAR | Status: AC
  Administered 2017-08-19: 10 mg via INTRAMUSCULAR

## 2017-08-19 MED ORDER — KETOROLAC TROMETHAMINE 30 MG/ML IJ SOLN
30.0000 mg | Freq: Once | INTRAMUSCULAR | Status: AC
  Administered 2017-08-19: 30 mg via INTRAMUSCULAR

## 2017-08-19 NOTE — ED Triage Notes (Signed)
Pt given ativan by PCP for headaches, no relief.

## 2017-08-19 NOTE — ED Triage Notes (Signed)
Pt c/o headache x3 days, makes him dizzy and nausea.

## 2017-08-19 NOTE — ED Provider Notes (Signed)
[facility]    CSN: [id number] Arrival date & time: 08/19/17  1929     History   Chief Complaint Chief Complaint  Patient presents with  . Headache    HPI Joshua Spencer is a 36 y.o. male.   36 year-old male, with history of chronic migraines, presenting today complaining of headache. States that this headache is different in that it is generalized. He has had some mild lightheadedness and nausea associated with the headache. He denies any change in vision, double vision, photophobia, vomiting, syncope, neck pain or stiffness   The history is provided by the patient.  Headache  Pain location:  Generalized Quality:  Dull Radiates to:  Does not radiate Severity currently:  5/10 Severity at highest:  5/10 Onset quality:  Gradual Duration:  4 days Timing:  Constant Progression:  Waxing and waning Chronicity:  New Similar to prior headaches: no   Context: not activity, not exposure to bright light, not caffeine and not coughing   Relieved by:  Nothing Worsened by:  Nothing Ineffective treatments: ativan - given to patient by PCP. Associated symptoms: dizziness and nausea   Associated symptoms: no abdominal pain, no back pain, no blurred vision, no congestion, no cough, no diarrhea, no drainage, no ear pain, no eye pain, no facial pain, no fatigue, no fever, no focal weakness, no hearing loss, no loss of balance, no myalgias, no near-syncope, no neck pain, no neck stiffness, no numbness, no paresthesias, no photophobia, no seizures, no sinus pressure, no sore throat, no swollen glands, no syncope, no tingling, no URI, no visual change, no vomiting and no weakness   Risk factors: no anger, does not have insomnia and lifestyle not sedentary     Past Medical History:  Diagnosis Date  . Migraines     There are no active problems to display for this patient.   History reviewed. No pertinent surgical history.     Home Medications    Prior to Admission medications    Medication Sig Start Date End Date Taking? Authorizing Provider  diclofenac (VOLTAREN) 75 MG EC tablet Take 1 tablet (75 mg total) by mouth 2 (two) times daily. 04/29/17   Dorena Bodo, NP  meclizine (ANTIVERT) 25 MG tablet Take 1 tablet (25 mg total) by mouth 3 (three) times daily as needed for dizziness or nausea. 01/13/17   Lurene Shadow, PA-C  nortriptyline (PAMELOR) 25 MG capsule Take 1 capsule (25 mg total) by mouth at bedtime. 02/06/17   Drema Dallas, DO  nortriptyline (PAMELOR) 50 MG capsule Take 1 capsule (50 mg total) by mouth at bedtime. 03/04/17   Everlena Cooper, Adam R, DO  ondansetron (ZOFRAN-ODT) 4 MG disintegrating tablet Take 1 tablet (4 mg total) by mouth every 8 (eight) hours as needed for nausea or vomiting. 08/19/17   Blue, Marylene Land, PA-C    Family History No family history on file.  Social History Social History  Substance Use Topics  . Smoking status: Never Smoker  . Smokeless tobacco: Never Used  . Alcohol use No     Allergies   Patient has no known allergies.   Review of Systems Review of Systems  Constitutional: Negative for chills, fatigue and fever.  HENT: Negative for congestion, ear pain, hearing loss, postnasal drip, sinus pressure and sore throat.   Eyes: Negative for blurred vision, photophobia, pain and visual disturbance.  Respiratory: Negative for cough and shortness of breath.   Cardiovascular: Negative for chest pain, palpitations, syncope and near-syncope.  Gastrointestinal: Positive for nausea. Negative for abdominal pain, diarrhea and vomiting.  Genitourinary: Negative for dysuria and hematuria.  Musculoskeletal: Negative for arthralgias, back pain, myalgias, neck pain and neck stiffness.  Skin: Negative for color change and rash.  Neurological: Positive for dizziness and headaches. Negative for focal weakness, seizures, syncope, weakness, numbness, paresthesias and loss of balance.  All other systems reviewed and are negative.    Physical  Exam Triage Vital Signs ED Triage Vitals  Enc Vitals Group     BP 08/19/17 1951 111/82     Pulse Rate 08/19/17 1951 74     Resp 08/19/17 1951 16     Temp 08/19/17 1951 98.6 F (37 C)     Temp Source 08/19/17 1951 Oral     SpO2 08/19/17 1951 100 %     Weight --      Height --      Head Circumference --      Peak Flow --      Pain Score 08/19/17 1952 10     Pain Loc --      Pain Edu? --      Excl. in GC? --    No data found.   Updated Vital Signs BP 111/82   Pulse 74   Temp 98.6 F (37 C) (Oral)   Resp 16   SpO2 100%   Visual Acuity Right Eye Distance:   Left Eye Distance:   Bilateral Distance:    Right Eye Near:   Left Eye Near:    Bilateral Near:     Physical Exam  Constitutional: He appears well-developed and well-nourished.  HENT:  Head: Normocephalic and atraumatic.  Eyes: Pupils are equal, round, and reactive to light. Conjunctivae and EOM are normal.  Neck: Neck supple.  Cardiovascular: Normal rate and regular rhythm.   No murmur heard. Pulmonary/Chest: Effort normal and breath sounds normal. No respiratory distress.  Abdominal: Soft. There is no tenderness.  Musculoskeletal: He exhibits no edema.  Neurological: He is alert. He has normal strength and normal reflexes. He is not disoriented. No cranial nerve deficit or sensory deficit. He displays a negative Romberg sign. GCS eye subscore is 4. GCS verbal subscore is 5. GCS motor subscore is 6.  Speech clear and confluent. No facial droop. Negative Romberg.  No nystagmus. Strength equal bilaterally in all extremities  Skin: Skin is warm and dry.  Psychiatric: He has a normal mood and affect.  Nursing note and vitals reviewed.    UC Treatments / Results  Labs (all labs ordered are listed, but only abnormal results are displayed) Labs Reviewed - No data to display  EKG  EKG Interpretation None       Radiology No results found.  Procedures Procedures (including critical care  time)  Medications Ordered in UC Medications  ketorolac (TORADOL) 30 MG/ML injection 30 mg (not administered)  dexamethasone (DECADRON) injection 10 mg (not administered)     Initial Impression / Assessment and Plan / UC Course  I have reviewed the triage vital signs and the nursing notes.  Pertinent labs & imaging results that were available during my care of the patient were reviewed by me and considered in my medical decision making (see chart for details).     Generalized headache - gradual onset past several days Associated with dizziness and nausea. Dizziness possibly related to ativan Given decadron and toradol and dc home with zofran Normal neuro exam without focal findings   Final Clinical Impressions(s) / UC Diagnoses  Final diagnoses:  Headache disorder    New Prescriptions New Prescriptions   ONDANSETRON (ZOFRAN-ODT) 4 MG DISINTEGRATING TABLET    Take 1 tablet (4 mg total) by mouth every 8 (eight) hours as needed for nausea or vomiting.     Controlled Substance Prescriptions [place] Controlled Substance Registry consulted? Not Applicable   Alecia LemmingBlue, Olivia C, [place] JerseyPA-C 08/19/17 2013

## 2017-11-26 ENCOUNTER — Ambulatory Visit (INDEPENDENT_AMBULATORY_CARE_PROVIDER_SITE_OTHER): Payer: BLUE CROSS/BLUE SHIELD | Admitting: Neurology

## 2017-11-26 ENCOUNTER — Encounter: Payer: Self-pay | Admitting: Neurology

## 2017-11-26 VITALS — BP 110/70 | HR 72 | Ht 66.0 in | Wt 188.4 lb

## 2017-11-26 DIAGNOSIS — R519 Headache, unspecified: Secondary | ICD-10-CM

## 2017-11-26 DIAGNOSIS — G44229 Chronic tension-type headache, not intractable: Secondary | ICD-10-CM

## 2017-11-26 DIAGNOSIS — R51 Headache: Secondary | ICD-10-CM | POA: Diagnosis not present

## 2017-11-26 MED ORDER — NORTRIPTYLINE HCL 50 MG PO CAPS: 100.0000 mg | ORAL_CAPSULE | Freq: Every day | ORAL | 3 refills | Status: DC

## 2017-11-26 NOTE — Progress Notes (Signed)
NEUROLOGY FOLLOW UP OFFICE NOTE  Joshua Spencer [id number]  HISTORY OF PRESENT ILLNESS:  Joshua Spencer is a 37 year old male with asthma and depression who follows up for chronic headache.  UPDATE: Headaches were resolved for awhile.  They returned in November.  They are more severe but not thunderclap headache.  He cannot recall any particular trigger.  There is no associated unilateral numbness or weakness.   Intensity:  severe Duration:  1 hour Frequency:  3 to 4 days a week, in the early afternoon Frequency of abortive medication: 1 to 2 days a week Current NSAIDS:  no Current analgesics:  acetaminophen Current triptans:  no Current anti-emetic:  no Current muscle relaxants:  no Current anti-anxiolytic:  no Current sleep aide:  no Current Antihypertensive medications:  no Current Antidepressant medications:  nortriptyline 50mg  Current Anticonvulsant medications:  no Current Vitamins/Herbal/Supplements:  no Current Antihistamines/Decongestants:  no Other therapy:  no   Caffeine:  no Alcohol:  no Smoker:  no Diet:  Drinks water and juice Exercise:  yes Depression:  Yes; Anxiety: Yes.  Reports significant work-related stress Sleep hygiene:  okay  HISTORY:  Onset:  He has had these headaches off and on since age 37.  Current headaches have been ongoing for about a month. Location:  Top of head and temples Quality:  squeezing Initial Intensity:  9/10 Aura:  no Prodrome:  no Associated symptoms:  Sometimes nausea.  No photophobia, phonophobia or visual disturbance.  He has not had any new worse headache of her life, waking up from sleep Initial Duration:  Up to 20 minutes  Initial Frequency:  About 6 times a day Initial Frequency of abortive medication: acetaminophen daily Triggers/exacerbating factors:  unknown Relieving factors:  rest Activity:  functions   Past NSAIDS:  Advil Past analgesics:  Tylenol Past abortive triptans:  Sumatriptan 100mg  (ineffective) Past  muscle relaxants:  no Past anti-emetic:  no Past antihypertensive medications:  no Past antidepressant medications:  no Past anticonvulsant medications:  no Past vitamins/Herbal/Supplements:  no Other past therapies:  no  Family history of headache:  No   CT of head from 01/23/17 was personally reviewed and is normal.  PAST MEDICAL HISTORY: Past Medical History:  Diagnosis Date  . Migraines     MEDICATIONS: Current Outpatient Medications on File Prior to Visit  Medication Sig Dispense Refill  . diclofenac (VOLTAREN) 75 MG EC tablet Take 1 tablet (75 mg total) by mouth 2 (two) times daily. 20 tablet 0  . meclizine (ANTIVERT) 25 MG tablet Take 1 tablet (25 mg total) by mouth 3 (three) times daily as needed for dizziness or nausea. 30 tablet 0  . ondansetron (ZOFRAN-ODT) 4 MG disintegrating tablet Take 1 tablet (4 mg total) by mouth every 8 (eight) hours as needed for nausea or vomiting. 20 tablet 0   No current facility-administered medications on file prior to visit.     ALLERGIES: No Known Allergies  FAMILY HISTORY: History reviewed. No pertinent family history.  SOCIAL HISTORY: Social History   Socioeconomic History  . Marital status: Single    Spouse name: Not on file  . Number of children: 0  . Years of education: Not on file  . Highest education level: Not on file  Social Needs  . Financial resource strain: Not on file  . Food insecurity - worry: Not on file  . Food insecurity - inability: Not on file  . Transportation needs - medical: Not on file  . Transportation needs - non-medical:  Not on file  Occupational History  . Occupation: Therapist, music: [employer]  Tobacco Use  . Smoking status: Never Smoker  . Smokeless tobacco: Never Used  Substance and Sexual Activity  . Alcohol use: No  . Drug use: No  . Sexual activity: Not on file  Other Topics Concern  . Not on file  Social History Narrative   Lives with mom in a one story home.  No  children.  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      REVIEW OF SYSTEMS: Constitutional: No fevers, chills, or sweats, no generalized fatigue, change in appetite Eyes: No visual changes, double vision, eye pain Ear, nose and throat: No hearing loss, ear pain, nasal congestion, sore throat Cardiovascular: No chest pain, palpitations Respiratory:  No shortness of breath at rest or with exertion, wheezes GastrointestinaI: No nausea, vomiting, diarrhea, abdominal pain, fecal incontinence Genitourinary:  No dysuria, urinary retention or frequency Musculoskeletal:  No neck pain, back pain Integumentary: No rash, pruritus, skin lesions Neurological: as above Psychiatric: No depression, insomnia, anxiety Endocrine: No palpitations, fatigue, diaphoresis, mood swings, change in appetite, change in weight, increased thirst Hematologic/Lymphatic:  No purpura, petechiae. Allergic/Immunologic: no itchy/runny eyes, nasal congestion, recent allergic reactions, rashes  PHYSICAL EXAM: Vitals:   11/26/17 0853  BP: 110/70  Pulse: 72  SpO2: 97%   General: No acute distress.  Patient appears well-groomed.   Head:  Normocephalic/atraumatic Eyes:  Fundi examined but not visualized Neck: supple, no paraspinal tenderness, full range of motion Heart:  Regular rate and rhythm Lungs:  Clear to auscultation bilaterally Back: No paraspinal tenderness Neurological Exam: alert and oriented to person, place, and time. Attention span and concentration intact, recent and remote memory intact, fund of knowledge intact.  Speech fluent and not dysarthric, language intact.  CN II-XII intact. Bulk and tone normal, muscle strength 5/5 throughout.  Sensation to light touch  intact.  Deep tendon reflexes 2+ throughout.  Finger to nose testing intact.  Gait normal, Romberg negative.  IMPRESSION: Chronic tension type headache.  Since they have returned frequently and more severe with no  particular trigger (such as new stressors), I will check MRI of brain to rule out secondary etiology.  PLAN: 1.  Increase nortriptyline to 100mg  at bedtime (take 2 50mg  pills at bedtime).  Contact me in 6 weeks.  If headaches not improved, I will start another medication in addition to the nortriptyline. 2.  May continue to take acetaminophen but limit to no more than 2 days out of the week to prevent rebound headache. 3.  Continue drinking plenty of water (stop fruit juice), exercise, and keep headache diary 4.  MRI of brain with and without contrast 5.  Follow up in 3 months.  Shon Millet, DO  CC:  Earlie Lou, MD

## 2017-11-26 NOTE — Patient Instructions (Signed)
1.  Increase nortriptyline to 100mg  at bedtime (take 2 50mg  pills at bedtime).  Contact me in 6 weeks.  If headaches not improved, I will start another medication in addition to the nortriptyline. 2.  May continue to take acetaminophen but limit to no more than 2 days out of the week to prevent rebound headache. 3.  Continue drinking plenty of water (stop fruit juice), exercise, and keep headache diary 4.  Follow up in 3 months.

## 2017-11-26 NOTE — Progress Notes (Signed)
Mri brai

## 2018-01-14 ENCOUNTER — Encounter (HOSPITAL_COMMUNITY): Payer: Self-pay | Admitting: Emergency Medicine

## 2018-01-14 ENCOUNTER — Ambulatory Visit (HOSPITAL_COMMUNITY)
Admission: EM | Admit: 2018-01-14 | Discharge: 2018-01-14 | Disposition: A | Discharge status: Home / Self Care | Payer: BLUE CROSS/BLUE SHIELD | Attending: Family Medicine | Admitting: Family Medicine

## 2018-01-14 DIAGNOSIS — K529 Noninfective gastroenteritis and colitis, unspecified: Secondary | ICD-10-CM | POA: Diagnosis not present

## 2018-01-14 MED ORDER — ONDANSETRON 4 MG PO TBDP: 4.0000 mg | ORAL_TABLET | Freq: Three times a day (TID) | ORAL | 0 refills | Status: DC | PRN

## 2018-01-14 NOTE — Discharge Instructions (Signed)

## 2018-01-14 NOTE — ED Triage Notes (Signed)
Pt sts vomiting yesterday and diarrhea today

## 2018-01-14 NOTE — ED Provider Notes (Signed)
[facility]   [id number] 01/14/18 Arrival Time: 1342  ASSESSMENT & PLAN:  1. Gastroenteritis     Meds ordered this encounter  Medications  . ondansetron (ZOFRAN-ODT) 4 MG disintegrating tablet    Sig: Take 1 tablet (4 mg total) by mouth every 8 (eight) hours as needed for nausea or vomiting.    Dispense:  15 tablet    Refill:  0    Discussed typical duration of symptoms for suspected viral GI illness. Will do his best to ensure adequate fluid intake in order to avoid dehydration. Will proceed to the Emergency Department for evaluation if unable to tolerate PO fluids regularly.  Otherwise he will f/u with his PCP or here if not showing improvement over the next 48-72 hours.  Reviewed expectations re: course of current medical issues. Questions answered. Outlined signs and symptoms indicating need for more acute intervention. Patient verbalized understanding. After Visit Summary given.   SUBJECTIVE: History from: patient.  Joshua SalonMark Spencer is a 37 y.o. male who presents with complaint of non-bloody intermittent nausea and vomiting of brown material with diarrhea. Onset abrupt, today. Abdominal discomfort: mild and cramping. Symptoms are stable since beginning. Aggravating factors: eating. Alleviating factors: none. Associated symptoms: fatigue. He denies fever. Appetite: decreased. PO intake: decreased. Ambulatory without assistance. Urinary symptoms: none. Last bowel movement today without blood. OTC treatment: none.   History reviewed. No pertinent surgical history.  ROS: As per HPI.  OBJECTIVE:  Vitals:   01/14/18 1441  BP: (!) 147/89  Pulse: 91  Resp: 18  Temp: 98.3 F (36.8 C)  TempSrc: Oral  SpO2: 99%    General appearance: alert; no distress Oropharynx: moist Lungs: clear to auscultation bilaterally Heart: regular rate and rhythm Abdomen: soft; non-distended; no significant abdominal tenderness, "just a cramping feeling"; bowel sounds present; no  masses or organomegaly; no guarding or rebound tenderness Back: no CVA tenderness Extremities: no edema; symmetrical with no gross deformities Skin: warm and dry Neurologic: normal gait Psychological: alert and cooperative; normal mood and affect  No Known Allergies                                             Past Medical History:  Diagnosis Date  . Migraines    Social History   Socioeconomic History  . Marital status: Single    Spouse name: Not on file  . Number of children: 0  . Years of education: Not on file  . Highest education level: Not on file  Occupational History  . Occupation: Therapist, musicelectronic associate    Employer: [email or web address]  Social Needs  . Financial resource strain: Not on file  . Food insecurity:    Worry: Not on file    Inability: Not on file  . Transportation needs:    Medical: Not on file    Non-medical: Not on file  Tobacco Use  . Smoking status: Never Smoker  . Smokeless tobacco: Never Used  Substance and Sexual Activity  . Alcohol use: No  . Drug use: No  . Sexual activity: Not on file  Lifestyle  . Physical activity:    Days per week: Not on file    Minutes per session: Not on file  . Stress: Not on file  Relationships  . Social connections:    Talks on phone: Not on file    Gets together: Not on file  Attends religious service: Not on file    Active member of club or organization: Not on file    Attends meetings of clubs or organizations: Not on file    Relationship status: Not on file  . Intimate partner violence:    Fear of current or ex partner: Not on file    Emotionally abused: Not on file    Physically abused: Not on file    Forced sexual activity: Not on file  Other Topics Concern  . Not on file  Social History Narrative   Lives with mom in a one story home.  No children.  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     History reviewed. No pertinent family history.   Mardella Layman, MD 01/17/18  1136

## 2018-02-13 ENCOUNTER — Ambulatory Visit
Admission: RE | Admit: 2018-02-13 | Discharge: 2018-02-13 | Disposition: A | Discharge status: Home / Self Care | Payer: BLUE CROSS/BLUE SHIELD | Source: Ambulatory Visit | Attending: Neurology | Admitting: Neurology

## 2018-02-13 ENCOUNTER — Telehealth: Payer: Self-pay

## 2018-02-13 DIAGNOSIS — R51 Headache: Principal | ICD-10-CM

## 2018-02-13 DIAGNOSIS — R519 Headache, unspecified: Secondary | ICD-10-CM

## 2018-02-13 MED ORDER — GADOBENATE DIMEGLUMINE 529 MG/ML IV SOLN
15.0000 mL | Freq: Once | INTRAVENOUS | Status: AC | PRN
  Administered 2018-02-13: 15 mL via INTRAVENOUS

## 2018-02-13 NOTE — Telephone Encounter (Signed)
-----   Message from Drema DallasAdam R Jaffe, DO sent at 02/13/2018 12:23 PM EDT ----- I don't see anything concerning on MRI.  We will discuss further at his followup.

## 2018-02-13 NOTE — Telephone Encounter (Signed)
Attempted to reach Pt, no answer, no VM 

## 2018-02-18 ENCOUNTER — Telehealth: Payer: Self-pay

## 2018-02-18 NOTE — Telephone Encounter (Signed)
Attempted again to reach Pt. No answer, no VM.

## 2018-02-18 NOTE — Telephone Encounter (Signed)
-----   Message from Drema Dallas, DO sent at 02/13/2018 12:23 PM EDT ----- I don't see anything concerning on MRI.  We will discuss further at his followup.

## 2018-03-06 ENCOUNTER — Ambulatory Visit (INDEPENDENT_AMBULATORY_CARE_PROVIDER_SITE_OTHER): Payer: BLUE CROSS/BLUE SHIELD | Admitting: Neurology

## 2018-03-06 ENCOUNTER — Encounter: Payer: Self-pay | Admitting: Neurology

## 2018-03-06 VITALS — BP 126/94 | HR 76 | Ht 66.0 in | Wt 189.0 lb

## 2018-03-06 DIAGNOSIS — G44219 Episodic tension-type headache, not intractable: Secondary | ICD-10-CM | POA: Diagnosis not present

## 2018-03-06 NOTE — Patient Instructions (Signed)
1.  Continue nortriptyline  at bedtime 2.  Limit use of pain relievers to no more than 2 days out of week to prevent rebound headache 3.  Try to get tinted glasses to wear at work 4.  Follow up in 6 months.

## 2018-03-06 NOTE — Progress Notes (Signed)
NEUROLOGY FOLLOW UP OFFICE NOTE  Joshua Spencer [id number]  HISTORY OF PRESENT ILLNESS: Joshua Spencer is a 37 year old male with asthma and depression who follows up for chronic headache (migraine versus tension type).   UPDATE: MRI of brain with and without contrast from 02/13/18 was personally reviewed and demonstrated nonspecific T2 and FLAIR hyperintensities in the subcortical and periventricular white matter, likely related to migraine/headaches. Intensity:  severe Duration:  Up to 1 hour Frequency:  2 days a week Frequency of abortive medication: 1 to 2 days a week Current NSAIDS:  no Current analgesics:  Acetaminophen (rarely.  Often doesn't need to treat the headache) Current triptans:  no Current anti-emetic:  no Current muscle relaxants:  no Current anti-anxiolytic:  no Current sleep aide:  no Current Antihypertensive medications:  no Current Antidepressant medications:  nortriptyline  Current Anticonvulsant medications:  no Current Vitamins/Herbal/Supplements:  no Current Antihistamines/Decongestants:  no Other therapy:  no   Caffeine:  no Alcohol:  no Smoker:  no Diet:  Drinks water and juice Exercise:  yes Depression:  Yes; Anxiety: Yes.  Reports significant work-related stress Sleep hygiene:  okay   HISTORY:  Onset:  He has had these headaches off and on since age 38.  Current headaches have been ongoing for about a month. Location:  Top of head and temples Quality:  squeezing Initial Intensity:  9/10 Aura:  no Prodrome:  no Associated symptoms:  Sometimes nausea.  No photophobia, phonophobia or visual disturbance.  He has not had any new worse headache of her life, waking up from sleep Initial Duration:  Up to 20 minutes  Initial Frequency:  About 6 times a day Initial Frequency of abortive medication: acetaminophen daily Triggers/exacerbating factors:  unknown Relieving factors:  rest Activity:  functions   Past NSAIDS:  Advil Past analgesics:   Tylenol Past abortive triptans:  Sumatriptan  (ineffective) Past muscle relaxants:  no Past anti-emetic:  no Past antihypertensive medications:  no Past antidepressant medications:  no Past anticonvulsant medications:  no Past vitamins/Herbal/Supplements:  no Other past therapies:  no  Family history of headache:  No   CT of head from 01/23/17 was personally reviewed and is normal.  PAST MEDICAL HISTORY: Past Medical History:  Diagnosis Date  . Migraines     MEDICATIONS: Current Outpatient Medications on File Prior to Visit  Medication Sig Dispense Refill  . diclofenac (VOLTAREN) 75 MG EC tablet Take 1 tablet (75 mg total) by mouth 2 (two) times daily. (Patient not taking: Reported on 03/06/2018) 20 tablet 0  . meclizine (ANTIVERT) 25 MG tablet Take 1 tablet (25 mg total) by mouth 3 (three) times daily as needed for dizziness or nausea. (Patient not taking: Reported on 03/06/2018) 30 tablet 0  . nortriptyline (PAMELOR) 50 MG capsule Take 2 capsules (100 mg total) by mouth at bedtime. 60 capsule 3  . ondansetron (ZOFRAN-ODT) 4 MG disintegrating tablet Take 1 tablet (4 mg total) by mouth every 8 (eight) hours as needed for nausea or vomiting. 15 tablet 0   No current facility-administered medications on file prior to visit.     ALLERGIES: No Known Allergies  FAMILY HISTORY: History reviewed. No pertinent family history.  SOCIAL HISTORY: Social History   Socioeconomic History  . Marital status: Single    Spouse name: Not on file  . Number of children: 0  . Years of education: Not on file  . Highest education level: Not on file  Occupational History  . Occupation: Forensic psychologist  Employer: [email or web address]  Social Needs  . Financial resource strain: Not on file  . Food insecurity:    Worry: Not on file    Inability: Not on file  . Transportation needs:    Medical: Not on file    Non-medical: Not on file  Tobacco Use  . Smoking status: Never Smoker  . Smokeless  tobacco: Never Used  Substance and Sexual Activity  . Alcohol use: No  . Drug use: No  . Sexual activity: Not on file  Lifestyle  . Physical activity:    Days per week: Not on file    Minutes per session: Not on file  . Stress: Not on file  Relationships  . Social connections:    Talks on phone: Not on file    Gets together: Not on file    Attends religious service: Not on file    Active member of club or organization: Not on file    Attends meetings of clubs or organizations: Not on file    Relationship status: Not on file  . Intimate partner violence:    Fear of current or ex partner: Not on file    Emotionally abused: Not on file    Physically abused: Not on file    Forced sexual activity: Not on file  Other Topics Concern  . Not on file  Social History Narrative   Lives with mom in a one story home.  No children.  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      REVIEW OF SYSTEMS: Constitutional: No fevers, chills, or sweats, no generalized fatigue, change in appetite Eyes: No visual changes, double vision, eye pain Ear, nose and throat: No hearing loss, ear pain, nasal congestion, sore throat Cardiovascular: No chest pain, palpitations Respiratory:  No shortness of breath at rest or with exertion, wheezes GastrointestinaI: No nausea, vomiting, diarrhea, abdominal pain, fecal incontinence Genitourinary:  No dysuria, urinary retention or frequency Musculoskeletal:  No neck pain, back pain Integumentary: No rash, pruritus, skin lesions Neurological: as above Psychiatric: No depression, insomnia, anxiety Endocrine: No palpitations, fatigue, diaphoresis, mood swings, change in appetite, change in weight, increased thirst Hematologic/Lymphatic:  No purpura, petechiae. Allergic/Immunologic: no itchy/runny eyes, nasal congestion, recent allergic reactions, rashes  PHYSICAL EXAM: Vitals:   03/06/18 1108  BP: (!) 126/94  Pulse: 76  SpO2:  96%   General: No acute distress.  Patient appears well-groomed.  Head:  Normocephalic/atraumatic Eyes:  Fundi examined but not visualized Neck: supple, no paraspinal tenderness, full range of motion Heart:  Regular rate and rhythm Lungs:  Clear to auscultation bilaterally Back: No paraspinal tenderness Neurological Exam: alert and oriented to person, place, and time. Attention span and concentration intact, recent and remote memory intact, fund of knowledge intact.  Speech fluent and not dysarthric, language intact.  CN II-XII intact. Bulk and tone normal, muscle strength 5/5 throughout.  Sensation to light touch  intact.  Deep tendon reflexes 2+ throughout.  Finger to nose testing intact.  Gait normal  IMPRESSION: Episodic tension-type headache, not intractable versus migraine.  Atypical presentation for both, he has features of both headache syndromes, but appears to fall more in line with tension-type headache.  PLAN: 1.  Continue nortriptyline  at bedtime 2.  Limit use of pain relievers to no more than 2 days out of week to prevent rebound headache 3.  Try to get tinted glasses to wear at work 4.  Follow up in 6 months.  Shon Millet, DO  CC: Dr. Mikeal Hawthorne

## 2018-03-19 ENCOUNTER — Telehealth: Payer: Self-pay | Admitting: Neurology

## 2018-03-19 NOTE — Telephone Encounter (Signed)
Pt called and wanted to let Dr Everlena Cooper know that his vision was affected along with some numbness and was advised by primary doctor to inform Dr Everlena Cooper, might be caused by the headaches

## 2018-03-19 NOTE — Telephone Encounter (Signed)
These are new symptoms.  He needs to make a follow up appointment for further evaluation

## 2018-03-19 NOTE — Telephone Encounter (Signed)
Please advise 

## 2018-03-24 ENCOUNTER — Emergency Department (HOSPITAL_COMMUNITY)
Admission: EM | Admit: 2018-03-24 | Discharge: 2018-03-25 | Disposition: A | Discharge status: Home / Self Care | Payer: BLUE CROSS/BLUE SHIELD | Attending: Emergency Medicine | Admitting: Emergency Medicine

## 2018-03-24 ENCOUNTER — Emergency Department (HOSPITAL_COMMUNITY): Payer: BLUE CROSS/BLUE SHIELD

## 2018-03-24 ENCOUNTER — Other Ambulatory Visit: Payer: Self-pay

## 2018-03-24 DIAGNOSIS — G43809 Other migraine, not intractable, without status migrainosus: Secondary | ICD-10-CM | POA: Insufficient documentation

## 2018-03-24 DIAGNOSIS — Z79899 Other long term (current) drug therapy: Secondary | ICD-10-CM | POA: Insufficient documentation

## 2018-03-24 DIAGNOSIS — R200 Anesthesia of skin: Secondary | ICD-10-CM | POA: Diagnosis present

## 2018-03-24 LAB — DIFFERENTIAL
ABS IMMATURE GRANULOCYTES: 0 10*3/uL (ref 0.0–0.1)
BASOS ABS: 0 10*3/uL (ref 0.0–0.1)
Basophils Relative: 1 %
EOS ABS: 0.1 10*3/uL (ref 0.0–0.7)
Eosinophils Relative: 2 %
Immature Granulocytes: 0 %
LYMPHS ABS: 2.4 10*3/uL (ref 0.7–4.0)
LYMPHS PCT: 48 %
Monocytes Absolute: 0.5 10*3/uL (ref 0.1–1.0)
Monocytes Relative: 9 %
Neutro Abs: 2 10*3/uL (ref 1.7–7.7)
Neutrophils Relative %: 40 %

## 2018-03-24 LAB — PROTIME-INR
INR: 0.95
Prothrombin Time: 12.5 seconds (ref 11.4–15.2)

## 2018-03-24 LAB — CBC
HEMATOCRIT: 50.2 % (ref 39.0–52.0)
Hemoglobin: 16 g/dL (ref 13.0–17.0)
MCH: 28.6 pg (ref 26.0–34.0)
MCHC: 31.9 g/dL (ref 30.0–36.0)
MCV: 89.8 fL (ref 78.0–100.0)
PLATELETS: 384 10*3/uL (ref 150–400)
RBC: 5.59 MIL/uL (ref 4.22–5.81)
RDW: 11.7 % (ref 11.5–15.5)
WBC: 5 10*3/uL (ref 4.0–10.5)

## 2018-03-24 LAB — COMPREHENSIVE METABOLIC PANEL
ALK PHOS: 71 U/L (ref 38–126)
ALT: 27 U/L (ref 17–63)
AST: 23 U/L (ref 15–41)
Albumin: 3.8 g/dL (ref 3.5–5.0)
Anion gap: 9 (ref 5–15)
BUN: 11 mg/dL (ref 6–20)
CALCIUM: 9 mg/dL (ref 8.9–10.3)
CO2: 25 mmol/L (ref 22–32)
CREATININE: 1.22 mg/dL (ref 0.61–1.24)
Chloride: 102 mmol/L (ref 101–111)
Glucose, Bld: 71 mg/dL (ref 65–99)
Potassium: 4.4 mmol/L (ref 3.5–5.1)
SODIUM: 136 mmol/L (ref 135–145)
Total Bilirubin: 0.8 mg/dL (ref 0.3–1.2)
Total Protein: 7.4 g/dL (ref 6.5–8.1)

## 2018-03-24 LAB — I-STAT CHEM 8, ED
BUN: 12 mg/dL (ref 6–20)
CALCIUM ION: 1.21 mmol/L (ref 1.15–1.40)
Chloride: 102 mmol/L (ref 101–111)
Creatinine, Ser: 1 mg/dL (ref 0.61–1.24)
Glucose, Bld: 85 mg/dL (ref 65–99)
HCT: 50 % (ref 39.0–52.0)
HEMOGLOBIN: 17 g/dL (ref 13.0–17.0)
Potassium: 4 mmol/L (ref 3.5–5.1)
Sodium: 140 mmol/L (ref 135–145)
TCO2: 25 mmol/L (ref 22–32)

## 2018-03-24 LAB — APTT: APTT: 34 s (ref 24–36)

## 2018-03-24 LAB — I-STAT TROPONIN, ED: TROPONIN I, POC: 0 ng/mL (ref 0.00–0.08)

## 2018-03-24 NOTE — ED Triage Notes (Signed)
Patient c/o left arm numbness, headache and blurred vision earlier that has resolved. LSN @ yesterday @ 12pm. No other deficits, VSS.

## 2018-03-24 NOTE — ED Provider Notes (Signed)
Patient placed in Quick Look pathway, seen and evaluated   Chief Complaint: left arm numbness, headache  HPI:   Joshua Spencer is a 37 y.o. male who present to the ED with left arm numbness. Patient reports that earlier he had headache and blurry vision but that has resolved.   ROS: Neuro: left arm numbness, headache  Physical Exam:  BP 135/90 (BP Location: Right Arm)   Pulse 93   Temp 98.2 F (36.8 C) (Oral)   Resp 18   Ht 5\' 6"  (1.676 m)   Wt 83.5 kg (184 lb)   SpO2 99%   BMI 29.70 kg/m    Gen: No distress  Neuro: Awake and Alert  Skin: Warm and dry      Initiation of care has begun. The patient has been counseled on the process, plan, and necessity for staying for the completion/evaluation, and the remainder of the medical screening examination    Janne Napoleoneese, Jule Whitsel M, NP 03/24/18 [place] [id number]    Long, Joshua G, MD 03/25/18 [phone]

## 2018-03-25 NOTE — Discharge Instructions (Addendum)
Follow-up with your neurologist in the next 2 to 3 days to discuss your condition.  Return to the emergency department if symptoms significantly worsen or change.

## 2018-03-25 NOTE — ED Provider Notes (Signed)
[facility] EM[facility]ENC[facility] Provider Note   CSN: [id number] Arrival date & time: 03/24/18  1827     History   Chief Complaint Chief Complaint  Patient presents with  . Numbness    HPI Joshua Spencer is a 37 y.o. male.  Patient is a 37 year old male with history of migraine headaches presenting for evaluation of headache, blurry vision, and left arm numbness.  He is being evaluated by Dr. Everlena CooperJaffe for this condition.  He had an MRI performed the end of April which revealed white matter changes consistent with a migrainous state.  He is now having episodes of numbness of his left arm and blurry vision in both eyes.  This has been ongoing for the past 1 to 2 weeks.  This occurs episodically and without exacerbating factor.  He describes the numbness as if his arm is "asleep".  He denies weakness or difficulty with coordination.  He denies any alleviating factors.  The history is provided by the patient.    Past Medical History:  Diagnosis Date  . Migraines     There are no active problems to display for this patient.   No past surgical history on file.      Home Medications    Prior to Admission medications   Medication Sig Start Date End Date Taking? Authorizing Provider  nortriptyline (PAMELOR) 50 MG capsule Take 2 capsules (100 mg total) by mouth at bedtime. 11/26/17  Yes Jaffe, Adam R, DO  ondansetron (ZOFRAN-ODT) 4 MG disintegrating tablet Take 1 tablet (4 mg total) by mouth every 8 (eight) hours as needed for nausea or vomiting. 01/14/18  Yes Hagler, Arlys JohnBrian, MD  diclofenac (VOLTAREN) 75 MG EC tablet Take 1 tablet (75 mg total) by mouth 2 (two) times daily. Patient not taking: Reported on 03/06/2018 04/29/17   Dorena BodoKennard, Lawrence, NP  meclizine (ANTIVERT) 25 MG tablet Take 1 tablet (25 mg total) by mouth 3 (three) times daily as needed for dizziness or nausea. Patient not taking: Reported on 03/06/2018 01/13/17   Rolla PlatePhelps, Erin O, PA-C    Family History No family  history on file.  Social History Social History   Tobacco Use  . Smoking status: Never Smoker  . Smokeless tobacco: Never Used  Substance Use Topics  . Alcohol use: No  . Drug use: No     Allergies   Patient has no known allergies.   Review of Systems Review of Systems  All other systems reviewed and are negative.    Physical Exam Updated Vital Signs BP (!) 131/93 (BP Location: Left Arm)   Pulse 69   Temp 97.9 F (36.6 C) (Oral)   Resp 18   Ht 5\' 6"  (1.676 m)   Wt 83.5 kg (184 lb)   SpO2 99%   BMI 29.70 kg/m   Physical Exam  Constitutional: He is oriented to person, place, and time. He appears well-developed and well-nourished. No distress.  HENT:  Head: Normocephalic and atraumatic.  Mouth/Throat: Oropharynx is clear and moist.  Eyes: Pupils are equal, round, and reactive to light. EOM are normal.  Neck: Normal range of motion. Neck supple.  Cardiovascular: Normal rate and regular rhythm. Exam reveals no friction rub.  No murmur heard. Pulmonary/Chest: Effort normal and breath sounds normal. No respiratory distress. He has no wheezes. He has no rales.  Abdominal: Soft. Bowel sounds are normal. He exhibits no distension. There is no tenderness.  Musculoskeletal: Normal range of motion. He exhibits no edema.  Neurological: He is  alert and oriented to person, place, and time. No cranial nerve deficit. He exhibits normal muscle tone. Coordination normal.  Skin: Skin is warm and dry. He is not diaphoretic.  Nursing note and vitals reviewed.    ED Treatments / Results  Labs (all labs ordered are listed, but only abnormal results are displayed) Labs Reviewed  PROTIME-INR  APTT  CBC  DIFFERENTIAL  COMPREHENSIVE METABOLIC PANEL  I-STAT TROPONIN, ED  I-STAT CHEM 8, ED  CBG MONITORING, ED    EKG EKG Interpretation  Date/Time:  Tuesday March 24 2018 18:56:09 EDT Ventricular Rate:  96 PR Interval:  146 QRS Duration: 82 QT Interval:  312 QTC  Calculation: 394 R Axis:   68 Text Interpretation:  Normal sinus rhythm with sinus arrhythmia Nonspecific T wave abnormality Abnormal ECG When compared with ECG of 01/23/2017, Premature ventricular complexes are no longer present Confirmed by Dione Booze ([id number]) on 03/24/2018 10:56:57 PM   Radiology Ct Head Wo Contrast  Result Date: 03/24/2018 CLINICAL DATA:  Headache. Intermittent blurry vision and left upper extremity numbness for 1 day. No reported injury. EXAM: CT HEAD WITHOUT CONTRAST TECHNIQUE: Contiguous axial images were obtained from the base of the skull through the vertex without intravenous contrast. COMPARISON:  01/23/2017 head CT. FINDINGS: Brain: No evidence of parenchymal hemorrhage or extra-axial fluid collection. No mass lesion, mass effect, or midline shift. No CT evidence of acute infarction. Cerebral volume is age appropriate. No ventriculomegaly. Vascular: No acute abnormality. Skull: No evidence of calvarial fracture. Sinuses/Orbits: No fluid levels. Scattered mucous retention cysts versus polyps in dependent maxillary sinuses bilaterally and right ethmoidal air cells. Other:  The mastoid air cells are unopacified. IMPRESSION: 1.  No evidence of acute intracranial abnormality. 2. Mucous retention cysts versus polyps scattered in the paranasal sinuses. Electronically Signed   By: Delbert Phenix M.D.   On: 03/24/2018 20:32    Procedures Procedures (including critical care time)  Medications Ordered in ED Medications - No data to display   Initial Impression / Assessment and Plan / ED Course  I have reviewed the triage vital signs and the nursing notes.  Pertinent labs & imaging results that were available during my care of the patient were reviewed by me and considered in my medical decision making (see chart for details).  Patient with history of migraine headaches presenting with ongoing headaches and now numbness in his left arm and blurry vision in both eyes that has been  occurring episodically for the past 1 to 2 weeks.  He underwent an MRI the end of April revealing findings consistent with migraine headaches, but no evidence for demyelinating disease.  His work-up today is unremarkable including CT scan and laboratory studies.  I have discussed the case with Dr. Laurence Slate from neurology he does not feel as though there would be much benefit in repeating the MRI.  His recommendations are for the patient to follow-up with his neurologist as an outpatient to discuss his situation.  Patient's symptoms have resolved upon discharge.  He is neurologically intact, vitals are stable, and he is in no distress.  Final Clinical Impressions(s) / ED Diagnoses   Final diagnoses:  None    ED Discharge Orders    None       Geoffery Lyons, MD 03/25/18 [phone]

## 2018-03-26 NOTE — Telephone Encounter (Signed)
Pt called and said he was in the ER and tests were preformed and will advise

## 2018-03-27 NOTE — Telephone Encounter (Signed)
Attempted to reach Pt, recording states, after 5 rings, the call can not be completed at this time and to try call again later.

## 2018-03-27 NOTE — Telephone Encounter (Signed)
I would like him to make an appointment to discuss this further.  It is likely a migraine but I am not aware that he ever had these symptoms with his headache before.  If this is not new, then no follow up is needed.

## 2018-03-27 NOTE — Telephone Encounter (Signed)
Unable to reach Pt.

## 2018-04-02 NOTE — Telephone Encounter (Signed)
I have tried on multiple occassions to reach Pt.

## 2018-04-07 NOTE — Telephone Encounter (Signed)
Called the telephone number listed for Pt's mother, Orpha BurRose Routson [phone]. A male answered the call, stated was the wrong number.

## 2018-04-07 NOTE — Telephone Encounter (Signed)
Sending letter

## 2018-04-22 ENCOUNTER — Ambulatory Visit (INDEPENDENT_AMBULATORY_CARE_PROVIDER_SITE_OTHER): Payer: BLUE CROSS/BLUE SHIELD | Admitting: Neurology

## 2018-04-22 ENCOUNTER — Other Ambulatory Visit: Payer: Self-pay

## 2018-04-22 ENCOUNTER — Encounter: Payer: Self-pay | Admitting: Neurology

## 2018-04-22 VITALS — BP 136/80 | HR 83 | Ht 66.0 in | Wt 190.0 lb

## 2018-04-22 DIAGNOSIS — R200 Anesthesia of skin: Secondary | ICD-10-CM

## 2018-04-22 DIAGNOSIS — R202 Paresthesia of skin: Secondary | ICD-10-CM | POA: Diagnosis not present

## 2018-04-22 NOTE — Patient Instructions (Addendum)
1.  We will start with MRI of cervical spine.  Further recommendations pending results.  We have sent a referral to [facility] for your MRI and they will call you directly to schedule your appt. They are located at [address] If you need to contact them directly please call [phone].

## 2018-04-22 NOTE — Progress Notes (Signed)
NEUROLOGY FOLLOW UP OFFICE NOTE  Joshua SalonMark Vanallen [id number]  HISTORY OF PRESENT ILLNESS: Joshua Spencer is a 37 year old male with asthma and depression who follows up for new symptoms.  He is accompanied by his mother who supplements history.   UPDATE: At the end of May, he started experiencing numbness and tingling of the entire left arm from shoulder down to entire hand.  At first it was intermittent but has become constant.  He will have fluctuations of increased pressure in the left upper arm from shoulder to elbow.  It would be associated with new left sided severe pressure-like headache and blurred vision.  These fluctuations would last 10 to 15 minutes and occurs 1 to 3 times daily.  He is unaware of any preceding event that may have caused this.  He denies any aggravating factors such as change in position.  Nothing relieves it.  There is no associated neck pain or weakness.  He presented to the ED on 03/24/18 for further evaluation.  CT of head was personally reviewed and unremarkable.   He continues on nortriptyline 100mg  at bedtime for tension headache prophylaxis.  MRI of brain with and without contrast from 02/13/18 was personally reviewed and demonstrated nonspecific T2 and FLAIR hyperintensities in the subcortical and periventricular white matter, likely related to migraine/headaches.  PAST MEDICAL HISTORY: Past Medical History:  Diagnosis Date  . Migraines     MEDICATIONS: Current Outpatient Medications on File Prior to Visit  Medication Sig Dispense Refill  . diclofenac (VOLTAREN) 75 MG EC tablet Take 1 tablet (75 mg total) by mouth 2 (two) times daily. (Patient not taking: Reported on 03/06/2018) 20 tablet 0  . meclizine (ANTIVERT) 25 MG tablet Take 1 tablet (25 mg total) by mouth 3 (three) times daily as needed for dizziness or nausea. (Patient not taking: Reported on 03/06/2018) 30 tablet 0  . nortriptyline (PAMELOR) 50 MG capsule Take 2 capsules (100 mg total) by mouth at bedtime.  60 capsule 3  . ondansetron (ZOFRAN-ODT) 4 MG disintegrating tablet Take 1 tablet (4 mg total) by mouth every 8 (eight) hours as needed for nausea or vomiting. 15 tablet 0   No current facility-administered medications on file prior to visit.     ALLERGIES: No Known Allergies  FAMILY HISTORY: No family history on file. .  SOCIAL HISTORY: Social History   Socioeconomic History  . Marital status: Single    Spouse name: Not on file  . Number of children: 0  . Years of education: Not on file  . Highest education level: Not on file  Occupational History  . Occupation: Therapist, musicelectronic associate    Employer: [id number]  Social Needs  . Financial resource strain: Not on file  . Food insecurity:    Worry: Not on file    Inability: Not on file  . Transportation needs:    Medical: Not on file    Non-medical: Not on file  Tobacco Use  . Smoking status: Never Smoker  . Smokeless tobacco: Never Used  Substance and Sexual Activity  . Alcohol use: No  . Drug use: No  . Sexual activity: Not on file  Lifestyle  . Physical activity:    Days per week: Not on file    Minutes per session: Not on file  . Stress: Not on file  Relationships  . Social connections:    Talks on phone: Not on file    Gets together: Not on file    Attends religious service: Not  on file    Active member of club or organization: Not on file    Attends meetings of clubs or organizations: Not on file    Relationship status: Not on file  . Intimate partner violence:    Fear of current or ex partner: Not on file    Emotionally abused: Not on file    Physically abused: Not on file    Forced sexual activity: Not on file  Other Topics Concern  . Not on file  Social History Narrative   Lives with mom in a one story home.  No children.  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      REVIEW OF SYSTEMS: Constitutional: No fevers, chills, or sweats, no generalized fatigue, change in  appetite Eyes: No visual changes, double vision, eye pain Ear, nose and throat: No hearing loss, ear pain, nasal congestion, sore throat Cardiovascular: No chest pain, palpitations Respiratory:  No shortness of breath at rest or with exertion, wheezes GastrointestinaI: No nausea, vomiting, diarrhea, abdominal pain, fecal incontinence Genitourinary:  No dysuria, urinary retention or frequency Musculoskeletal:  No neck pain, back pain Integumentary: No rash, pruritus, skin lesions Neurological: as above Psychiatric: No depression, insomnia, anxiety Endocrine: No palpitations, fatigue, diaphoresis, mood swings, change in appetite, change in weight, increased thirst Hematologic/Lymphatic:  No purpura, petechiae. Allergic/Immunologic: no itchy/runny eyes, nasal congestion, recent allergic reactions, rashes  PHYSICAL EXAM: Vitals:   04/22/18 0850  BP: 136/80  Pulse: 83  SpO2: 96%   General: No acute distress.  Patient appears well-groomed.   Head:  Normocephalic/atraumatic Eyes:  Fundi examined but not visualized Neck: supple, no paraspinal tenderness, full range of motion Heart:  Regular rate and rhythm Lungs:  Clear to auscultation bilaterally Back: No paraspinal tenderness Neurological Exam: alert and oriented to person, place, and time. Attention span and concentration intact, recent and remote memory intact, fund of knowledge intact.  Speech fluent and not dysarthric, language intact.  CN II-XII intact. Bulk and tone normal, muscle strength 5/5 throughout.  Sensation to pinprick reduced in the entire left upper extremity up to the left side of his neck.  Vibration sensation intact.  Deep tendon reflexes 2+ throughout, toes downgoing.  Finger to nose testing intact.  Gait normal, Romberg negative.  IMPRESSION: 1.  Left upper extremity numbness and tingling.  He reports brief episodes of new left sided headache with pain in that arm as well.  No appreciable weakness. I would like to  first evaluate for a lesion in the cervical spine that may be causing a radiculopathy  PLAN: 1.  MRI of cervical spine.  Further recommendations (such as left upper extremity NCV-EMG or repeating MRI of brain) pending results. 2.  Nortriptyline 100mg  at bedtime.  Shon Millet, DO  CC:  Dr. Mikeal Hawthorne

## 2018-05-07 ENCOUNTER — Ambulatory Visit
Admission: RE | Admit: 2018-05-07 | Discharge: 2018-05-07 | Disposition: A | Discharge status: Home / Self Care | Payer: BLUE CROSS/BLUE SHIELD | Source: Ambulatory Visit | Attending: Neurology | Admitting: Neurology

## 2018-05-07 DIAGNOSIS — R202 Paresthesia of skin: Principal | ICD-10-CM

## 2018-05-07 DIAGNOSIS — R200 Anesthesia of skin: Secondary | ICD-10-CM

## 2018-05-13 ENCOUNTER — Telehealth: Payer: Self-pay

## 2018-05-13 NOTE — Telephone Encounter (Signed)
Called Pt, LMOVM 

## 2018-05-13 NOTE — Telephone Encounter (Signed)
-----   Message from Drema DallasAdam R Jaffe, DO sent at 05/11/2018  7:22 AM EDT ----- There are a couple of mild disc bulges but does not look significant or convincing as cause of arm numbness.  I would like to proceed with NCV-EMG of left upper extremity.

## 2018-05-14 ENCOUNTER — Telehealth: Payer: Self-pay | Admitting: Neurology

## 2018-05-14 NOTE — Telephone Encounter (Signed)
Called and LMOVM for Pt to call me back. 

## 2018-05-14 NOTE — Telephone Encounter (Signed)
Patient calling for MRI results. Please Call. Thanks  °

## 2018-05-18 ENCOUNTER — Telehealth: Payer: Self-pay | Admitting: Neurology

## 2018-05-18 DIAGNOSIS — R202 Paresthesia of skin: Principal | ICD-10-CM

## 2018-05-18 DIAGNOSIS — R200 Anesthesia of skin: Secondary | ICD-10-CM

## 2018-05-18 DIAGNOSIS — G44219 Episodic tension-type headache, not intractable: Secondary | ICD-10-CM

## 2018-05-18 MED ORDER — NORTRIPTYLINE HCL 50 MG PO CAPS: 100.0000 mg | ORAL_CAPSULE | Freq: Every day | ORAL | 3 refills | Status: DC

## 2018-05-18 NOTE — Telephone Encounter (Signed)
Pt lvm inquiring about results of MRI.

## 2018-05-18 NOTE — Telephone Encounter (Signed)
Called and spoke with Pt, advised of C spine MRI results and EMG recommendations.

## 2018-05-26 DIAGNOSIS — Z0279 Encounter for issue of other medical certificate: Secondary | ICD-10-CM

## 2018-06-11 ENCOUNTER — Ambulatory Visit (INDEPENDENT_AMBULATORY_CARE_PROVIDER_SITE_OTHER): Payer: Self-pay | Admitting: Neurology

## 2018-06-11 ENCOUNTER — Encounter: Payer: Self-pay | Admitting: Neurology

## 2018-06-11 DIAGNOSIS — G5622 Lesion of ulnar nerve, left upper limb: Secondary | ICD-10-CM

## 2018-06-11 DIAGNOSIS — R200 Anesthesia of skin: Secondary | ICD-10-CM

## 2018-06-11 DIAGNOSIS — R202 Paresthesia of skin: Secondary | ICD-10-CM

## 2018-06-11 NOTE — Procedures (Signed)
[facility]  [address], [address]  [place], [place] [address] Tel: [phone] Fax:  [phone] Test Date:  06/11/2018  Patient: Joshua SalonMark Klink DOB: [birth year] Physician: Nita Sickleonika Astin Rape, DO  Sex: Male Height: 5\' 6"  Ref Phys: Shon MilletAdam Jaffe, D.O.  ID#: [id number] Temp: 37.4C Technician:    Patient Complaints: This is a 37 year-old man referred for evaluation of left arm pain and paresthesias.  NCV & EMG Findings: Extensive electrodiagnostic testing of the left upper extremity shows:  1. Left median, ulnar, and mixed palmar sensory responses are within normal limits. 2. Left median motor responses within normal limits. Left ulnar motor response shows decreased conduction velocity across the elbow (A Elbow-B Elbow, 43 m/s).   3. There is no evidence of active or chronic motor axon loss changes affecting any of the tested muscles. Motor unit configuration and recruitment pattern is within normal limits.   Impression: Left ulnar neuropathy with slowing across the elbow, purely demyelinating in type, and mild in degree electrically.   ___________________________ Nita Sickleonika Amanuel Sinkfield, DO    Nerve Conduction Studies Anti Sensory Summary Table   Stim Site NR Peak (ms) Norm Peak (ms) P-T Amp (V) Norm P-T Amp  Left Median Anti Sensory (2nd Digit)  37.4C  Wrist    2.8 <3.4 29.6 >20  Left Ulnar Anti Sensory (5th Digit)  37.4C  Wrist    2.7 <3.1 23.3 >12   Motor Summary Table   Stim Site NR Onset (ms) Norm Onset (ms) O-P Amp (mV) Norm O-P Amp Site1 Site2 Delta-0 (ms) Dist (cm) Vel (m/s) Norm Vel (m/s)  Left Median Motor (Abd Poll Brev)  37.4C  Wrist    2.9 <3.9 8.9 >6 Elbow Wrist 4.8 29.0 60 >50  Elbow    7.7  8.7         Left Ulnar Motor (Abd Dig Minimi)  37.4C  Wrist    2.8 <3.1 13.3 >7 B Elbow Wrist 3.6 26.0 72 >50  B Elbow    6.4  11.7  A Elbow B Elbow 2.3 10.0 43 >50  A Elbow    8.7  10.9          Comparison Summary Table   Stim Site NR Peak (ms) Norm Peak (ms) P-T Amp  (V) Site1 Site2 Delta-P (ms) Norm Delta (ms)  Left Median/Ulnar Palm Comparison (Wrist - 8cm)  37.4C  Median Palm    1.6 <2.2 65.8 Median Palm Ulnar Palm 0.1   Ulnar Palm    1.5 <2.2 22.2       EMG   Side Muscle Ins Act Fibs Psw Fasc Number Recrt Dur Dur. Amp Amp. Poly Poly. Comment  Left 1stDorInt Nml Nml Nml Nml Nml Nml Nml Nml Nml Nml Nml Nml N/A  Left PronatorTeres Nml Nml Nml Nml Nml Nml Nml Nml Nml Nml Nml Nml N/A  Left Biceps Nml Nml Nml Nml Nml Nml Nml Nml Nml Nml Nml Nml N/A  Left Triceps Nml Nml Nml Nml Nml Nml Nml Nml Nml Nml Nml Nml N/A  Left Deltoid Nml Nml Nml Nml Nml Nml Nml Nml Nml Nml Nml Nml N/A  Left ABD Dig Min Nml Nml Nml Nml Nml Nml Nml Nml Nml Nml Nml Nml N/A  Left FlexCarpiUln Nml Nml Nml Nml Nml Nml Nml Nml Nml Nml Nml Nml N/A      Waveforms:

## 2018-06-16 ENCOUNTER — Telehealth: Payer: Self-pay

## 2018-06-16 NOTE — Telephone Encounter (Signed)
Called Pt, LMOVM advising of EMG results and to call if would like to make an appt to discuss further.

## 2018-06-16 NOTE — Telephone Encounter (Signed)
-----   Message from Drema DallasAdam R Jaffe, DO sent at 06/12/2018  2:52 PM EDT ----- Nerve study shows evidence of a pinched nerve at the elbow, however I don't think that explains all of his symptoms.  At this point, I don't have a neurologic explanation for the arm pain.  He may want to make a follow up appointment to discuss further.

## 2018-07-08 ENCOUNTER — Telehealth: Payer: Self-pay | Admitting: Neurology

## 2018-07-08 NOTE — Telephone Encounter (Signed)
Patient wants the results of the EMG please call

## 2018-07-08 NOTE — Telephone Encounter (Signed)
Called and LMOVM advising of EMG results, and to call office to make appointment if would like to discuss further with Dr Everlena CooperJaffe

## 2018-08-04 ENCOUNTER — Encounter (HOSPITAL_COMMUNITY): Payer: Self-pay

## 2018-08-04 ENCOUNTER — Ambulatory Visit (HOSPITAL_COMMUNITY)
Admission: EM | Admit: 2018-08-04 | Discharge: 2018-08-04 | Disposition: A | Discharge status: Home / Self Care | Payer: BLUE CROSS/BLUE SHIELD | Attending: Family Medicine | Admitting: Family Medicine

## 2018-08-04 DIAGNOSIS — G43909 Migraine, unspecified, not intractable, without status migrainosus: Secondary | ICD-10-CM

## 2018-08-04 DIAGNOSIS — R202 Paresthesia of skin: Secondary | ICD-10-CM

## 2018-08-04 DIAGNOSIS — R200 Anesthesia of skin: Secondary | ICD-10-CM

## 2018-08-04 MED ORDER — IBUPROFEN 800 MG PO TABS: 800.0000 mg | ORAL_TABLET | Freq: Three times a day (TID) | ORAL | 0 refills | Status: DC

## 2018-08-04 MED ORDER — ONDANSETRON 4 MG PO TBDP: 4.0000 mg | ORAL_TABLET | Freq: Three times a day (TID) | ORAL | 0 refills | Status: DC | PRN

## 2018-08-04 NOTE — ED Provider Notes (Signed)
[facility]    CSN: [id number] Arrival date & time: 08/04/18  1703     History   Chief Complaint Chief Complaint  Patient presents with  . Numbness    HPI Joshua Spencer is a 37 y.o. male.   HPI  Joshua Spencer is here for headache and left arm numbness.  He is been complaining of these same times for many months.  He is under the care of neurology.  He has had an MRI of his brain, he has had nerve conduction studies.  He is had treatment.  He is here today because he did not feel he was able to go to work given the level of his headache with some vomiting.  The numbness is still in his whole left arm.  He does not have any medicine to take for headache.  He states he does have some Zofran at home.  No new injury.  No cold symptoms.  No trauma.  No change in his symptoms from prior.  No photophobia or visual symptoms.  No nausea or vomiting.  Past Medical History:  Diagnosis Date  . Migraines     There are no active problems to display for this patient.   History reviewed. No pertinent surgical history.     Home Medications    Prior to Admission medications   Medication Sig Start Date End Date Taking? Authorizing Provider  ibuprofen (ADVIL,MOTRIN) 800 MG tablet Take 1 tablet (800 mg total) by mouth 3 (three) times daily. 08/04/18   Eustace Moore, MD  nortriptyline (PAMELOR) 50 MG capsule Take 2 capsules (100 mg total) by mouth at bedtime. 05/18/18   Everlena Cooper, Adam R, DO  ondansetron (ZOFRAN-ODT) 4 MG disintegrating tablet Take 1 tablet (4 mg total) by mouth every 8 (eight) hours as needed for nausea or vomiting. 08/04/18   Eustace Moore, MD    Family History No family history on file. Patient states no family history of migraine or headaches.  Mother has hypertension. Social History Social History   Tobacco Use  . Smoking status: Never Smoker  . Smokeless tobacco: Never Used  Substance Use Topics  . Alcohol use: No  . Drug use: No     Allergies     Patient has no known allergies.   Review of Systems Review of Systems  Constitutional: Negative for chills and fever.  HENT: Negative for ear pain and sore throat.   Eyes: Negative for pain and visual disturbance.  Respiratory: Negative for cough and shortness of breath.   Cardiovascular: Negative for chest pain and palpitations.  Gastrointestinal: Negative for abdominal pain and vomiting.  Genitourinary: Negative for dysuria and hematuria.  Musculoskeletal: Negative for arthralgias and back pain.  Skin: Negative for color change and rash.  Neurological: Positive for numbness and headaches. Negative for seizures and syncope.  All other systems reviewed and are negative.    Physical Exam Triage Vital Signs ED Triage Vitals  Enc Vitals Group     BP 08/04/18 1715 137/88     Pulse Rate 08/04/18 1715 81     Resp 08/04/18 1715 18     Temp 08/04/18 1715 97.8 F (36.6 C)     Temp Source 08/04/18 1715 Oral     SpO2 08/04/18 1715 99 %     Weight --      Height --      Head Circumference --      Peak Flow --      Pain Score 08/04/18  1717 8     Pain Loc --      Pain Edu? --      Excl. in GC? --    No data found.  Updated Vital Signs BP 137/88 (BP Location: Right Arm)   Pulse 81   Temp 97.8 F (36.6 C) (Oral)   Resp 18   SpO2 99%      Physical Exam  Constitutional: He is oriented to person, place, and time. He appears well-developed and well-nourished. No distress.  HENT:  Head: Normocephalic and atraumatic.  Right Ear: External ear normal.  Left Ear: External ear normal.  Mouth/Throat: Oropharynx is clear and moist.  Eyes: Pupils are equal, round, and reactive to light. Conjunctivae are normal.  Eyes normal.  Disks flat  Neck: Normal range of motion. Neck supple.  Cardiovascular: Normal rate, regular rhythm and normal heart sounds.  Pulmonary/Chest: Effort normal and breath sounds normal. No respiratory distress.  Abdominal: Soft. He exhibits no distension.   Musculoskeletal: Normal range of motion. He exhibits no edema.  Lymphadenopathy:    He has no cervical adenopathy.  Neurological: He is alert and oriented to person, place, and time. He displays normal reflexes. No cranial nerve deficit. He exhibits normal muscle tone. Coordination normal.  Glove distribution of sensory deficit from elbow down  Skin: Skin is warm and dry.  Psychiatric: He has a normal mood and affect. His behavior is normal.     UC Treatments / Results  Labs (all labs ordered are listed, but only abnormal results are displayed) Labs Reviewed - No data to display  EKG None  Radiology No results found.  Procedures Procedures (including critical care time)  Medications Ordered in UC Medications - No data to display  Initial Impression / Assessment and Plan / UC Course  I have reviewed the triage vital signs and the nursing notes.  Pertinent labs & imaging results that were available during my care of the patient were reviewed by me and considered in my medical decision making (see chart for details).      Final Clinical Impressions(s) / UC Diagnoses   Final diagnoses:  Migraine without status migrainosus, not intractable, unspecified migraine type  Numbness and tingling in left arm     Discharge Instructions     Take the ibuprofen 3 x a day with food - for headache Take the zofran if needed for nausea Go home and rest Follow up with your neurologist or PCP    ED Prescriptions    Medication Sig Dispense Auth. Provider   ondansetron (ZOFRAN-ODT) 4 MG disintegrating tablet Take 1 tablet (4 mg total) by mouth every 8 (eight) hours as needed for nausea or vomiting. 15 tablet Eustace Moore, MD   ibuprofen (ADVIL,MOTRIN) 800 MG tablet Take 1 tablet (800 mg total) by mouth 3 (three) times daily. 21 tablet Eustace Moore, MD     Controlled Substance Prescriptions [place] Controlled Substance Registry consulted? Not Applicable   Eustace Moore, MD 08/04/18 2103

## 2018-08-04 NOTE — Discharge Instructions (Signed)
Take the ibuprofen 3 x a day with food - for headache Take the zofran if needed for nausea Go home and rest Follow up with your neurologist or PCP

## 2018-08-04 NOTE — ED Triage Notes (Addendum)
Pt states he has left arm numbness this started yesterday. Pt states she has a headache as well.

## 2018-08-23 ENCOUNTER — Other Ambulatory Visit: Payer: Self-pay

## 2018-08-23 ENCOUNTER — Encounter (HOSPITAL_COMMUNITY): Payer: Self-pay | Admitting: Emergency Medicine

## 2018-08-23 ENCOUNTER — Ambulatory Visit (HOSPITAL_COMMUNITY)
Admission: EM | Admit: 2018-08-23 | Discharge: 2018-08-23 | Disposition: A | Discharge status: Home / Self Care | Payer: BLUE CROSS/BLUE SHIELD | Attending: Family Medicine | Admitting: Family Medicine

## 2018-08-23 DIAGNOSIS — G43719 Chronic migraine without aura, intractable, without status migrainosus: Secondary | ICD-10-CM | POA: Diagnosis not present

## 2018-08-23 MED ORDER — KETOROLAC TROMETHAMINE 60 MG/2ML IM SOLN
INTRAMUSCULAR | Status: AC
  Filled 2018-08-23: qty 2

## 2018-08-23 MED ORDER — KETOROLAC TROMETHAMINE 60 MG/2ML IM SOLN
60.0000 mg | Freq: Once | INTRAMUSCULAR | Status: AC
  Administered 2018-08-23: 60 mg via INTRAMUSCULAR

## 2018-08-23 MED ORDER — TOPIRAMATE 50 MG PO TABS: 50.0000 mg | ORAL_TABLET | Freq: Every day | ORAL | 0 refills | Status: DC

## 2018-08-23 NOTE — ED Triage Notes (Signed)
Has appt neurologist in 2 weeks.  Patient headaches continue and dizziness is increasing.

## 2018-08-23 NOTE — ED Provider Notes (Signed)
[facility]    CSN: [id number] Arrival date & time: 08/23/18  1240     History   Chief Complaint Chief Complaint  Patient presents with  . Headache    HPI Joshua Spencer is a 37 y.o. male.   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  He has been having frequent headaches for the last year.  He had a CT scan which was negative.  He found that the ibuprofen prescribed last time was working for several days but then it started wearing off.  The last for 5 days then gradually getting worse.  His headache is located in the frontal and occipital areas bilaterally.  He has had some nausea but no vomiting.  Patient's had no scotoma.  He had no fever or head injury.  He has an appointment with Dr. Everlena Cooper in 2 weeks.     Past Medical History:  Diagnosis Date  . Migraines     There are no active problems to display for this patient.   History reviewed. No pertinent surgical history.     Home Medications    Prior to Admission medications   Medication Sig Start Date End Date Taking? Authorizing Provider  topiramate (TOPAMAX) 50 MG tablet Take 1 tablet (50 mg total) by mouth at bedtime. 08/23/18   Elvina Sidle, MD    Family History History reviewed. No pertinent family history.  Social History Social History   Tobacco Use  . Smoking status: Never Smoker  . Smokeless tobacco: Never Used  Substance Use Topics  . Alcohol use: No  . Drug use: No     Allergies   Patient has no known allergies.   Review of Systems Review of Systems  Gastrointestinal: Positive for nausea.  Neurological: Positive for headaches.  All other systems reviewed and are negative.    Physical Exam Triage Vital Signs ED Triage Vitals [08/23/18 1415]  Enc Vitals Group     BP 127/87     Pulse Rate 88     Resp 18     Temp 97.9 F (36.6 C)     Temp Source Oral     SpO2 97 %     Weight      Height      Head Circumference      Peak Flow     Pain Score      Pain Loc      Pain Edu?      Excl. in GC?    No data found.  Updated Vital Signs BP 127/87 (BP Location: Left Arm)   Pulse 88   Temp 97.9 F (36.6 C) (Oral)   Resp 18   SpO2 97%    Physical Exam  Constitutional: He is oriented to person, place, and time. He appears well-developed and well-nourished.  HENT:  Head: Normocephalic and atraumatic.  Mouth/Throat: Oropharynx is clear and moist.  Eyes: Pupils are equal, round, and reactive to light. EOM are normal.  Neck: Normal range of motion. Neck supple.  Cardiovascular: Normal rate and normal heart sounds.  Pulmonary/Chest: Effort normal.  Neurological: He is alert and oriented to person, place, and time. No cranial nerve deficit.  Skin: Skin is warm and dry.  Psychiatric: He has a normal mood and affect. His behavior is normal.  Nursing note and vitals reviewed.    UC Treatments / Results  Labs (all labs ordered are listed, but only abnormal results are displayed) Labs Reviewed - No data to  display  EKG None  Radiology No results found.  Procedures Procedures (including critical care time)  Medications Ordered in UC Medications  ketorolac (TORADOL) injection 60 mg (has no administration in time range)    Initial Impression / Assessment and Plan / UC Course  I have reviewed the triage vital signs and the nursing notes.  Pertinent labs & imaging results that were available during my care of the patient were reviewed by me and considered in my medical decision making (see chart for details).   Final Clinical Impressions(s) / UC Diagnoses   Final diagnoses:  Intractable chronic migraine without aura and without status migrainosus   Discharge Instructions   None    ED Prescriptions    Medication Sig Dispense Auth. Provider   topiramate (TOPAMAX) 50 MG tablet Take 1 tablet (50 mg total) by mouth at bedtime. 21 tablet Elvina Sidle, MD     Controlled Substance Prescriptions [place]  Controlled Substance Registry consulted? Not Applicable   Elvina Sidle, MD 08/23/18 1426

## 2018-08-24 ENCOUNTER — Telehealth: Payer: Self-pay | Admitting: Neurology

## 2018-08-24 NOTE — Telephone Encounter (Signed)
Called and spoke with Pt, offered to put him on wait list, there is an appt avaliable at 7:50am tomorrow

## 2018-08-24 NOTE — Telephone Encounter (Signed)
Patient called and wanted to let Dr. Everlena Cooper know that he is still having headaches behind his Eye. Thanks

## 2018-08-25 ENCOUNTER — Encounter: Payer: Self-pay | Admitting: Neurology

## 2018-08-25 ENCOUNTER — Ambulatory Visit (INDEPENDENT_AMBULATORY_CARE_PROVIDER_SITE_OTHER): Payer: BLUE CROSS/BLUE SHIELD | Admitting: Neurology

## 2018-08-25 VITALS — BP 144/86 | HR 88 | Ht 66.0 in | Wt 189.0 lb

## 2018-08-25 DIAGNOSIS — R200 Anesthesia of skin: Secondary | ICD-10-CM

## 2018-08-25 DIAGNOSIS — R519 Headache, unspecified: Secondary | ICD-10-CM

## 2018-08-25 DIAGNOSIS — R202 Paresthesia of skin: Secondary | ICD-10-CM | POA: Diagnosis not present

## 2018-08-25 DIAGNOSIS — R51 Headache: Secondary | ICD-10-CM | POA: Diagnosis not present

## 2018-08-25 MED ORDER — NAPROXEN 500 MG PO TABS: 500.0000 mg | ORAL_TABLET | Freq: Two times a day (BID) | ORAL | 2 refills | Status: DC | PRN

## 2018-08-25 NOTE — Progress Notes (Signed)
NEUROLOGY FOLLOW UP OFFICE NOTE  Joshua Spencer [id number]  HISTORY OF PRESENT ILLNESS: Joshua Spencer is a 37 year old male with asthma and depression who follows up for chronic headaches (migraine and tension type).  He is accompanied by his mother who supplements history.  UPDATE: Headaches have persisted, requiring two ED visits on 08/04/18 and 08/23/14.  Headaches are now occurring several times daily.  He described it has worsening bilateral frontal and occipital headache as well as severe pressure behind the eyes, associated with blurred vision and nausea and dizzy spells.  They last up to 30-35 minutes and then he needs to go to sleep.  He is taking acetaminophen twice daily.  He continues to have constant left upper extremity numbness and tingling in a glove distribution.  MRI of the cervical spine from 05/07/2018 was personally reviewed and revealed mild degenerative changes with mild asymmetric disc bulging on the left at C4-5 and C5-6 with disc bulging on the left but negative for significant spinal or foraminal stenosis.  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  He is no longer on nortriptyline (refills ran out).  He was started on topiramate 50mg  at bedtime at Urgent Care on 08/23/18.  Current NSAIDS:  none Current analgesics: Acetaminophen Current triptans: None Current ergotamine: None Current anti-emetic: None Current muscle relaxants: None Current anti-anxiolytic: None Current sleep aide: None Current Antihypertensive medications: None Current Antidepressant medications: None Current Anticonvulsant medications: topiramate 50mg  at bedtime Current anti-CGRP: None Current Vitamins/Herbal/Supplements: None Current Antihistamines/Decongestants: None Other therapy: None  Caffeine: No Diet: Drinks water and juice Exercise: Yes Depression: Yes; Anxiety:  Yes Other pain: No Sleep hygiene: Okay  HISTORY:  Onset: Off and on since age 23. Quality:  squeezing Initial Intensity:  9/10 Aura:  no Prodrome:  no Associated symptoms:  Sometimes nausea.  No photophobia, phonophobia or visual disturbance.  He has not had any new worse headache of her life, waking up from sleep Initial Duration:  Up to 20 minutes  Initial Frequency:  About 6 times a day Initial Frequency of abortive medication: acetaminophen daily Triggers/exacerbating factors:  unknown Relieving factors:  rest Activity:  functions  Past NSAIDS:  Advil Past analgesics:  Tylenol Past abortive triptans:  Sumatriptan 100mg  (ineffective) Past muscle relaxants:  no Past anti-emetic:  no Past antihypertensive medications:  no Past antidepressant medications:  no Past anticonvulsant medications:  no Past vitamins/Herbal/Supplements:  no Other past therapies:  no Family history of headache:  No  At the end of May, he started experiencing numbness and tingling of the entire left arm from shoulder down to entire hand.  At first it was intermittent but has become constant.  He will have fluctuations of increased pressure in the left upper arm from shoulder to elbow.  It would be associated with new left sided severe pressure-like headache and blurred vision.  These fluctuations would last 10 to 15 minutes and occurs 1 to 3 times daily.  He is unaware of any preceding event that may have caused this.  He denies any aggravating factors such as change in position.  Nothing relieves it.  There is no associated neck pain or weakness.  He presented to the ED on 03/24/18 for further evaluation.  CT of head was personally reviewed and unremarkable.   MRI of brain with and without contrast from 02/13/18 was personally reviewed and demonstrated nonspecific T2 and FLAIR  hyperintensities in the subcortical and periventricular white matter, likely related to migraine/headaches.  CT of head from 01/23/17  was personally reviewed and is normal.  PAST MEDICAL HISTORY: Past Medical History:  Diagnosis Date  . Migraines     MEDICATIONS: Current Outpatient Medications on File Prior to Visit  Medication Sig Dispense Refill  . topiramate (TOPAMAX) 50 MG tablet Take 1 tablet (50 mg total) by mouth at bedtime. 21 tablet 0   No current facility-administered medications on file prior to visit.     ALLERGIES: No Known Allergies  FAMILY HISTORY: No family history on file.  SOCIAL HISTORY: Social History   Socioeconomic History  . Marital status: Single    Spouse name: Not on file  . Number of children: 0  . Years of education: Not on file  . Highest education level: Not on file  Occupational History  . Occupation: Therapist, music: [id number]  Social Needs  . Financial resource strain: Not on file  . Food insecurity:    Worry: Not on file    Inability: Not on file  . Transportation needs:    Medical: Not on file    Non-medical: Not on file  Tobacco Use  . Smoking status: Never Smoker  . Smokeless tobacco: Never Used  Substance and Sexual Activity  . Alcohol use: No  . Drug use: No  . Sexual activity: Not on file  Lifestyle  . Physical activity:    Days per week: Not on file    Minutes per session: Not on file  . Stress: Not on file  Relationships  . Social connections:    Talks on phone: Not on file    Gets together: Not on file    Attends religious service: Not on file    Active member of club or organization: Not on file    Attends meetings of clubs or organizations: Not on file    Relationship status: Not on file  . Intimate partner violence:    Fear of current or ex partner: Not on file    Emotionally abused: Not on file    Physically abused: Not on file    Forced sexual activity: Not on file  Other Topics Concern  . Not on file  Social History Narrative   Lives with mom in a one story home.  No children.  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      REVIEW OF SYSTEMS: Constitutional: No fevers, chills, or sweats, no generalized fatigue, change in appetite Eyes: No visual changes, double vision, eye pain Ear, nose and throat: No hearing loss, ear pain, nasal congestion, sore throat Cardiovascular: No chest pain, palpitations Respiratory:  No shortness of breath at rest or with exertion, wheezes GastrointestinaI: No nausea, vomiting, diarrhea, abdominal pain, fecal incontinence Genitourinary:  No dysuria, urinary retention or frequency Musculoskeletal:  No neck pain, back pain Integumentary: No rash, pruritus, skin lesions Neurological: as above Psychiatric: No depression, insomnia, anxiety Endocrine: No palpitations, fatigue, diaphoresis, mood swings, change in appetite, change in weight, increased thirst Hematologic/Lymphatic:  No purpura, petechiae. Allergic/Immunologic: no itchy/runny eyes, nasal congestion, recent allergic reactions, rashes  PHYSICAL EXAM: Blood pressure (!) 144/86, pulse 88, height 5\' 6"  (1.676 m), weight 189 lb (85.7 kg), SpO2 96 %. General: No acute distress.  Patient appears well-groomed.   Head:  Normocephalic/atraumatic Eyes:  Fundi examined but not visualized Neck: supple, no paraspinal tenderness, full range of motion Heart:  Regular rate  and rhythm Lungs:  Clear to auscultation bilaterally Back: No paraspinal tenderness Neurological Exam: alert and oriented to person, place, and time. Attention span and concentration intact, recent and remote memory intact, fund of knowledge intact.  Speech fluent and not dysarthric, language intact.  CN II-XII intact. Bulk and tone normal, muscle strength 5/5 throughout.  Sensation to pinprick reduced in the left upper extremity, vibration intact.  Deep tendon reflexes 2+ throughout, toes downgoing.  Finger to nose and heel to shin testing intact.  Gait normal, Romberg negative.  IMPRESSION: 1.  Worsening headaches 2.   Left arm numbness and tingling  Headaches likely migraines.  However, as he has had worsening daily headaches, as well as left arm numbness and tingling without explanation, I think MRI/MRA of head is warranted to rule out secondary etiology (mass lesion, stroke, aneurysm, etc)  PLAN: 1.  MRI and MRA of head without contrast. 2.  In 1 week increase topiramate from 50 mg to 100 mg at bedtime. 3.  Stop acetaminophen.  For abortive therapy, he will try naproxen 500 mg. 4.  Limit use of pain relievers to no more than 2 days out of the week to prevent risk of rebound headache. 5.  Keep headache diary 6.  Follow-up in 3 months.  Shon Millet, DO  CC: Earlie Lou, MD

## 2018-08-25 NOTE — Patient Instructions (Addendum)
1.  In one week, increase topiramate to 2 tablets (100mg ) at bedtime.  Contact me for refills. 2.  Stop acetaminophen.  At earliest onset of headache, take naproxen 500mg .  May repeat once in 12 hours if needed 3. Limit use of pain relievers to no more than 2 days out of week to prevent risk of rebound or medication-overuse headache. 4.  Keep headache diary 5.  Will check MRI and MRA of head 6.  Follow up in 3 months.  We have sent a referral to [facility] for your MRI and MRA and they will call you directly to schedule your appt. They are located at [address] [facility]. If you need to contact them directly please call [phone].

## 2018-09-07 ENCOUNTER — Ambulatory Visit: Payer: BLUE CROSS/BLUE SHIELD | Admitting: Neurology

## 2018-09-23 ENCOUNTER — Ambulatory Visit (HOSPITAL_COMMUNITY)
Admission: EM | Admit: 2018-09-23 | Discharge: 2018-09-23 | Disposition: A | Discharge status: Home / Self Care | Payer: BLUE CROSS/BLUE SHIELD | Attending: Family Medicine | Admitting: Family Medicine

## 2018-09-23 ENCOUNTER — Encounter (HOSPITAL_COMMUNITY): Payer: Self-pay

## 2018-09-23 DIAGNOSIS — G43709 Chronic migraine without aura, not intractable, without status migrainosus: Secondary | ICD-10-CM | POA: Diagnosis not present

## 2018-09-23 MED ORDER — DEXAMETHASONE SODIUM PHOSPHATE 10 MG/ML IJ SOLN
10.0000 mg | Freq: Once | INTRAMUSCULAR | Status: AC
  Administered 2018-09-23: 10 mg via INTRAMUSCULAR

## 2018-09-23 MED ORDER — KETOROLAC TROMETHAMINE 60 MG/2ML IM SOLN
INTRAMUSCULAR | Status: AC
  Filled 2018-09-23: qty 2

## 2018-09-23 MED ORDER — DEXAMETHASONE SODIUM PHOSPHATE 10 MG/ML IJ SOLN
INTRAMUSCULAR | Status: AC
  Filled 2018-09-23: qty 1

## 2018-09-23 MED ORDER — METOCLOPRAMIDE HCL 5 MG/ML IJ SOLN
INTRAMUSCULAR | Status: AC
  Filled 2018-09-23: qty 2

## 2018-09-23 MED ORDER — METOCLOPRAMIDE HCL 5 MG/ML IJ SOLN
5.0000 mg | Freq: Once | INTRAMUSCULAR | Status: AC
  Administered 2018-09-23: 5 mg via INTRAMUSCULAR

## 2018-09-23 MED ORDER — KETOROLAC TROMETHAMINE 60 MG/2ML IM SOLN
60.0000 mg | Freq: Once | INTRAMUSCULAR | Status: AC
  Administered 2018-09-23: 60 mg via INTRAMUSCULAR

## 2018-09-23 NOTE — ED Provider Notes (Signed)
[facility]    CSN: [id number] Arrival date & time: 09/23/18  1659     History   Chief Complaint Chief Complaint  Patient presents with  . Migraine    HPI Joshua Spencer is a 37 y.o. male.   Recent onset of migraines.  Patient has been seen by neurologist and had brain scan checking for tumor or aneurysm that was negative.  His headache is not unlike previous ones some nausea but no vomiting there are characteristics which seem like tension or stress as it begins in his trapezius muscles and spreads up the back of his head and around.  He works in Engineering geologist at [employer].  There is no family history of migraine  HPI  Past Medical History:  Diagnosis Date  . Migraines     There are no active problems to display for this patient.   History reviewed. No pertinent surgical history.     Home Medications    Prior to Admission medications   Medication Sig Start Date End Date Taking? Authorizing Provider  naproxen (NAPROSYN) 500 MG tablet Take 1 tablet (500 mg total) by mouth every 12 (twelve) hours as needed. 08/25/18   Drema Dallas, DO  topiramate (TOPAMAX) 50 MG tablet Take 1 tablet (50 mg total) by mouth at bedtime. 08/23/18   Elvina Sidle, MD    Family History History reviewed. No pertinent family history.  Social History Social History   Tobacco Use  . Smoking status: Never Smoker  . Smokeless tobacco: Never Used  Substance Use Topics  . Alcohol use: No  . Drug use: No     Allergies   Patient has no known allergies.   Review of Systems Review of Systems  Neurological: Positive for headaches.  All other systems reviewed and are negative.    Physical Exam Triage Vital Signs ED Triage Vitals  Enc Vitals Group     BP 09/23/18 1806 (!) 147/100     Pulse Rate 09/23/18 1806 75     Resp 09/23/18 1806 20     Temp 09/23/18 1806 98 F (36.7 C)     Temp Source 09/23/18 1806 Oral     SpO2 09/23/18 1806 95 %     Weight --      Height --      Head  Circumference --      Peak Flow --      Pain Score 09/23/18 1807 10     Pain Loc --      Pain Edu? --      Excl. in GC? --    No data found.  Updated Vital Signs BP (!) 147/100 (BP Location: Left Arm)   Pulse 75   Temp 98 F (36.7 C) (Oral)   Resp 20   SpO2 95%   Visual Acuity Right Eye Distance:   Left Eye Distance:   Bilateral Distance:    Right Eye Near:   Left Eye Near:    Bilateral Near:     Physical Exam  Constitutional: He is oriented to person, place, and time. He appears well-developed and well-nourished.  HENT:  Head: Normocephalic.  Mouth/Throat: Oropharynx is clear and moist.  Cardiovascular: Normal rate and regular rhythm.  Neurological: He is alert and oriented to person, place, and time. He displays normal reflexes. No sensory deficit. He exhibits normal muscle tone.  Nursing note and vitals reviewed.    UC Treatments / Results  Labs (all labs ordered are listed, but only abnormal results  are displayed) Labs Reviewed - No data to display  EKG None  Radiology No results found.  Procedures Procedures (including critical care time)  Medications Ordered in UC Medications - No data to display  Initial Impression / Assessment and Plan / UC Course  I have reviewed the triage vital signs and the nursing notes.  Pertinent labs & imaging results that were available during my care of the patient were reviewed by me and considered in my medical decision making (see chart for details).     Headache, possible migraine variant.  Blood pressure is also elevated tonight.  There is no history of hypertension so I suspect the blood pressures are related to his discomfort but have asked him to keep an eye toward the blood pressure Final Clinical Impressions(s) / UC Diagnoses   Final diagnoses:  None   Discharge Instructions   None    ED Prescriptions    None     Controlled Substance Prescriptions [place] Controlled Substance Registry consulted? No     Frederica KusterMiller, Remington Skalsky M, MD 09/23/18 [id number]

## 2018-09-23 NOTE — ED Triage Notes (Signed)
Pt presents with ongoing migraine. Pt complains of migraine headache, sensitivity to light and nausea.  Pt is currently seeing a neurologist to figure out cause of migraines.

## 2018-10-18 ENCOUNTER — Ambulatory Visit
Admission: RE | Admit: 2018-10-18 | Discharge: 2018-10-18 | Disposition: A | Discharge status: Home / Self Care | Payer: BLUE CROSS/BLUE SHIELD | Source: Ambulatory Visit | Attending: Neurology | Admitting: Neurology

## 2018-10-18 DIAGNOSIS — R200 Anesthesia of skin: Secondary | ICD-10-CM

## 2018-10-18 DIAGNOSIS — R51 Headache: Principal | ICD-10-CM

## 2018-10-18 DIAGNOSIS — R519 Headache, unspecified: Secondary | ICD-10-CM

## 2018-10-18 DIAGNOSIS — R202 Paresthesia of skin: Secondary | ICD-10-CM

## 2018-10-20 ENCOUNTER — Telehealth: Payer: Self-pay

## 2018-10-20 NOTE — Telephone Encounter (Signed)
-----   Message from Drema DallasAdam R Jaffe, DO sent at 10/19/2018  7:45 AM EST ----- MRI does not show anything concerning

## 2018-10-20 NOTE — Telephone Encounter (Signed)
Called and LMOVM advising of MRI results

## 2018-10-28 ENCOUNTER — Telehealth: Payer: Self-pay | Admitting: Neurology

## 2018-10-28 MED ORDER — TOPIRAMATE 50 MG PO TABS: 50.0000 mg | ORAL_TABLET | Freq: Every day | ORAL | 2 refills | Status: DC

## 2018-10-28 NOTE — Telephone Encounter (Signed)
Called and spoke with Pt. I advised him of results. Pt wanted to know what next steps are. I advised him to keep f/u appt and continue topiramate. Pt states has been out of topiramate for a while. I advised him I will send in a refill. He will need to again start with 50 mg for 1 week then increase to 100 mg. Pt verbalized understanding.

## 2018-10-28 NOTE — Telephone Encounter (Signed)
Patient needing recent MRI/MRA results. Please call him back at [phone]. Thanks!

## 2018-12-10 ENCOUNTER — Telehealth: Payer: Self-pay

## 2018-12-10 NOTE — Telephone Encounter (Signed)
Patient is needs to call back and make schedule New patient appointment.

## 2018-12-15 NOTE — Progress Notes (Signed)
NEUROLOGY FOLLOW UP OFFICE NOTE  Joshua Spencer [id number]  HISTORY OF PRESENT ILLNESS: Joshua Spencer is a 38 year old man with asthma and depression who follows up for chronic headache.  He is accompanied by his mother who supplements history.  UPDATE: Due to worsening headaches and left arm numbness, he had MRI and MRA of brain without contrast on 10/18/18, which was personally reviewed and demonstrated stable nonspecific cerebral white matter changes but no acute intracranial abnormality.  He was started on topiramate 50mg  at bedtime.  He was supposed to increase to 100mg  after a week but he never increased the dose.  He reports increased headaches.  They are severe, they last 2-3 hours up to 2 days with fluctuating intensity.  It occurs every other day.  He treats with naproxen or acetaminophen. On three occasions, the pain was so severe that he had a presyncopal event but never actually passed out.  No palpitations.  He was referred to cardiology.  His job told him to take a leave of absence.    Current NSAIDS:  naproxen Current analgesics: acetaminophen Current triptans: None Current ergotamine: None Current anti-emetic: None Current muscle relaxants: None Current anti-anxiolytic: None Current sleep aide: None Current Antihypertensive medications: None Current Antidepressant medications: None Current Anticonvulsant medications: topiramate 50mg  at bedtime Current anti-CGRP: None Current Vitamins/Herbal/Supplements: None Current Antihistamines/Decongestants: None Other therapy: None  Caffeine: No Diet: Drinks water and juice Exercise: Yes Depression: Yes; Anxiety: Yes Other pain: No Sleep hygiene: Okay  HISTORY:  Onset: Off and on since age 24. Quality: squeezing Initial Intensity: 9/10 Aura: no Prodrome: no Associated symptoms: Sometimes nausea. No photophobia, phonophobia or visual disturbance. He has not had any new worse headache of her life, waking up from  sleep Initial Duration: Up to 20 minutes  Initial Frequency: About 6 times a day Initial Frequency of abortive medication: acetaminophen daily Triggers/exacerbating factors: unknown Relieving factors: rest Activity: functions  Past NSAIDS: Advil Past analgesics: Tylenol Past abortive triptans: Sumatriptan 100mg  (ineffective) Past muscle relaxants: no Past anti-emetic: no Past antihypertensive medications: no Past antidepressant medications: nortriptyline Past anticonvulsant medications: no Past vitamins/Herbal/Supplements: no Other past therapies: no Family history of headache: No  At the end of May 2019, he startedexperiencing numbness and tingling of the entire left arm from shoulder down to entire hand. At first it was intermittent but has become constant. He will have fluctuations of increased pressure in the left upper arm from shoulder to elbow. It would be associated with new left sided severe pressure-like headache and blurred vision. MRI of the cervical spine from 05/07/2018 was personally reviewed and revealed mild degenerative changes with mild asymmetric disc bulging on the left at C4-5 and C5-6 with disc bulging on the left but negative for significant spinal or foraminal stenosis.  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  MRI of brain with and without contrast from 02/13/18 was personally reviewed and demonstrated nonspecific T2 and FLAIR hyperintensities in the subcortical and periventricular white matter, likely related to migraine/headaches.  CT of head from 01/23/17 was personally reviewed and is normal.  PAST MEDICAL HISTORY: Past Medical History:  Diagnosis Date  . Migraines     MEDICATIONS: Current Outpatient Medications on File Prior to Visit  Medication Sig Dispense Refill  . naproxen (NAPROSYN) 500 MG  tablet Take 1 tablet (500 mg total) by mouth every 12 (twelve) hours as  needed. 20 tablet 2  . topiramate (TOPAMAX) 50 MG tablet Take 1 tablet (50 mg total) by mouth at bedtime. 21 tablet 0  . topiramate (TOPAMAX) 50 MG tablet Take 1 tablet (50 mg total) by mouth daily. For 1 week, then increase to 100 mg. 60 tablet 2   No current facility-administered medications on file prior to visit.     ALLERGIES: No Known Allergies  FAMILY HISTORY: No family history on file.  SOCIAL HISTORY: Social History   Socioeconomic History  . Marital status: Single    Spouse name: Not on file  . Number of children: 0  . Years of education: Not on file  . Highest education level: Not on file  Occupational History  . Occupation: Therapist, musicelectronic associate    Employer: [email or web address]  Social Needs  . Financial resource strain: Not on file  . Food insecurity:    Worry: Not on file    Inability: Not on file  . Transportation needs:    Medical: Not on file    Non-medical: Not on file  Tobacco Use  . Smoking status: Never Smoker  . Smokeless tobacco: Never Used  Substance and Sexual Activity  . Alcohol use: No  . Drug use: No  . Sexual activity: Not on file  Lifestyle  . Physical activity:    Days per week: Not on file    Minutes per session: Not on file  . Stress: Not on file  Relationships  . Social connections:    Talks on phone: Not on file    Gets together: Not on file    Attends religious service: Not on file    Active member of club or organization: Not on file    Attends meetings of clubs or organizations: Not on file    Relationship status: Not on file  . Intimate partner violence:    Fear of current or ex partner: Not on file    Emotionally abused: Not on file    Physically abused: Not on file    Forced sexual activity: Not on file  Other Topics Concern  . Not on file  Social History Narrative   Lives with mom in a one story home.  No children.  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  Education: [employer].      REVIEW OF SYSTEMS: Constitutional: No fevers, chills, or sweats, no generalized fatigue, change in appetite Eyes: No visual changes, double vision, eye pain Ear, nose and throat: No hearing loss, ear pain, nasal congestion, sore throat Cardiovascular: No chest pain, palpitations Respiratory:  No shortness of breath at rest or with exertion, wheezes GastrointestinaI: No nausea, vomiting, diarrhea, abdominal pain, fecal incontinence Genitourinary:  No dysuria, urinary retention or frequency Musculoskeletal:  No neck pain, back pain Integumentary: No rash, pruritus, skin lesions Neurological: as above Psychiatric: No depression, insomnia, anxiety Endocrine: No palpitations, fatigue, diaphoresis, mood swings, change in appetite, change in weight, increased thirst Hematologic/Lymphatic:  No purpura, petechiae. Allergic/Immunologic: no itchy/runny eyes, nasal congestion, recent allergic reactions, rashes  PHYSICAL EXAM: Blood pressure 126/88, pulse 84, height 5\' 6"  (1.676 m), weight 192 lb (87.1 kg), SpO2 95 %. General: No acute distress.  Patient appears well-groomed.   Head:  Normocephalic/atraumatic Eyes:  Fundi examined but not visualized Neck: supple, no paraspinal tenderness, full range of motion Heart:  Regular rate and rhythm Lungs:  Clear to auscultation bilaterally Back: No paraspinal tenderness Neurological Exam: alert and oriented to person, place, and time. Attention span and concentration  intact, recent and remote memory intact, fund of knowledge intact.  Speech fluent and not dysarthric, language intact.  CN II-XII intact. Bulk and tone normal, muscle strength 5/5 throughout.  Sensation to light touch, temperature and vibration intact.  Deep tendon reflexes 2+ throughout, toes downgoing.  Finger to nose and heel to shin testing intact.  Gait normal, Romberg negative.  IMPRESSION: 1.  Chronic migraine without aura, without status  migrainosus, not intractable.  Increased headaches.  Secondary etiologies ruled out.  Neurologic exam without objective deficits.  Possibly stress-induced. 2.  Near-syncopal events.  In setting of pain, consider vasovagal event.  Has upcoming cardiology appointment to rule out cardiac etiology.  PLAN: 1.  For preventative management, start Emgality.  Continue topiramate 50mg  at bedtime for now. 2.  For abortive therapy, rizatriptan 10mg  3.  Limit use of pain relievers to no more than 2 days out of week to prevent risk of rebound or medication-overuse headache. 4.  Keep headache diary 5.  Exercise, hydration, caffeine cessation, sleep hygiene, monitor for and avoid triggers 6.  Consider:  magnesium citrate 400mg  daily, riboflavin 400mg  daily, and coenzyme Q10 100mg  three times daily 7.  Follow up in 4 months   Shon Millet, DO  CC: Earlie Lou, MD

## 2018-12-17 ENCOUNTER — Encounter: Payer: Self-pay | Admitting: Neurology

## 2018-12-17 ENCOUNTER — Ambulatory Visit (INDEPENDENT_AMBULATORY_CARE_PROVIDER_SITE_OTHER): Payer: BLUE CROSS/BLUE SHIELD | Admitting: Neurology

## 2018-12-17 VITALS — BP 126/88 | HR 84 | Ht 66.0 in | Wt 192.0 lb

## 2018-12-17 DIAGNOSIS — R55 Syncope and collapse: Secondary | ICD-10-CM

## 2018-12-17 DIAGNOSIS — G43709 Chronic migraine without aura, not intractable, without status migrainosus: Secondary | ICD-10-CM

## 2018-12-17 MED ORDER — RIZATRIPTAN BENZOATE 10 MG PO TBDP: 10.0000 mg | ORAL_TABLET | ORAL | 3 refills | Status: DC | PRN

## 2018-12-17 MED ORDER — GALCANEZUMAB-GNLM 120 MG/ML ~~LOC~~ SOAJ: 240.0000 mg | Freq: Once | SUBCUTANEOUS | 0 refills | Status: AC

## 2018-12-17 MED ORDER — GALCANEZUMAB-GNLM 120 MG/ML ~~LOC~~ SOAJ: 120.0000 mg | SUBCUTANEOUS | 11 refills | Status: DC

## 2018-12-17 NOTE — Patient Instructions (Signed)
1.  For preventative management, we will start Emgality once monthly.  Continue topiramate 50mg  at bedtime 2.  For abortive therapy, rizatriptan 10mg  at earliest onset of migraine.  May repeat once after 2 hours if needed.  Maximum 2 tablets in 24 hours 3.  Limit use of pain relievers to no more than 2 days out of week to prevent risk of rebound or medication-overuse headache. 4.  Keep headache diary 5.  Exercise, hydration, caffeine cessation, sleep hygiene, monitor for and avoid triggers 6.  Consider:  magnesium citrate 400mg  daily, riboflavin 400mg  daily, and coenzyme Q10 100mg  three times daily 7.  Follow up in 4 months

## 2019-04-19 ENCOUNTER — Ambulatory Visit: Payer: Self-pay | Admitting: Neurology

## 2019-04-21 ENCOUNTER — Ambulatory Visit: Payer: Self-pay | Admitting: Neurology

## 2019-06-02 IMAGING — MR MR MRA HEAD W/O CM
10 series · 48 of 48 positions shown · non-contrast
Comparison: Head CT 03/24/2018 and MRI 02/13/2018

CLINICAL DATA: Frequent headaches. Left arm numbness. Dizziness.
Symptoms for approximately 1.5 years.

EXAM:
MRI HEAD WITHOUT CONTRAST
MRA HEAD WITHOUT CONTRAST
TECHNIQUE: Multiplanar, multiecho pulse sequences of the brain and surrounding
structures were obtained without intravenous contrast. Angiographic
images of the head were obtained using MRA technique without
contrast.

[Series 5: T1 · sagittal · 4.0mm · 0.75mm/px · 3 of 32 slices shown (1 of 2)]
[im 1/32]
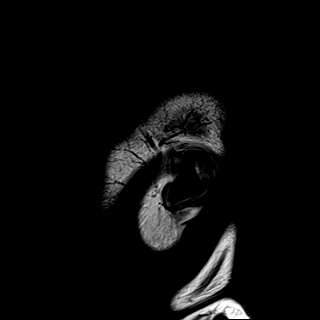
[im 16/32]
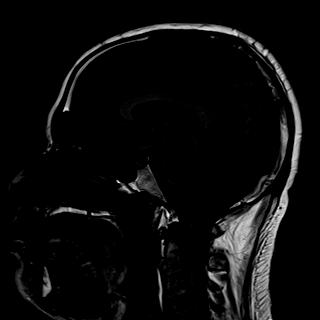
[im 32/32]
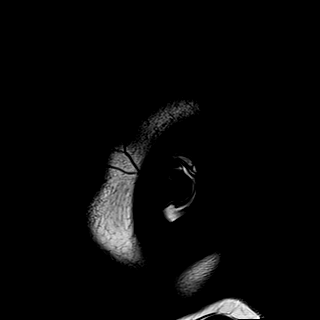

[Series 6: DWI · axial · 3.0mm · 1.44mm/px · z∈[-33,+117]mm · 7 of 94 slices shown (1 of 4)]
[im 1/94]
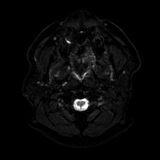
[im 16/94]
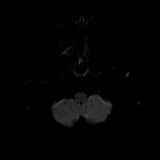
[im 32/94]
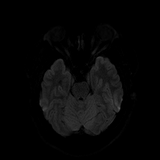
[im 47/94]
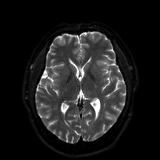
[im 63/94]
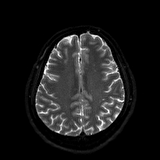
[im 78/94]
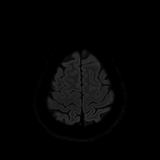
[im 94/94]
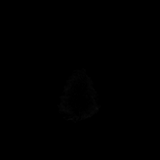

[Series 7: DWI · axial · 3.0mm · 1.44mm/px · z∈[-33,+117]mm · 3 of 46 slices shown (2 of 4)]
[im 1/46]
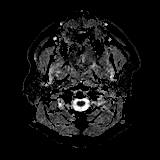
[im 23/46]
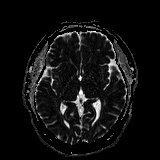
[im 46/46]
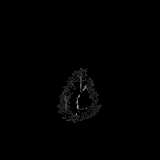

[Series 8: DWI · coronal · 5.0mm · 1.44mm/px · 5 of 68 slices shown (3 of 4)]
[im 1/68]
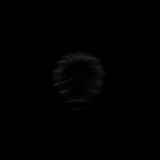
[im 17/68]
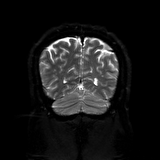
[im 34/68]
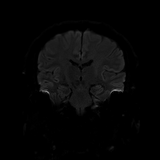
[im 51/68]
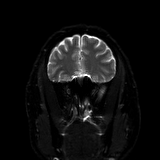
[im 68/68]
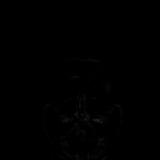

[Series 9: DWI · coronal · 5.0mm · 1.44mm/px · 3 of 34 slices shown (4 of 4)]
[im 1/34]
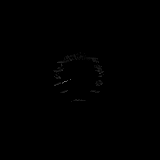
[im 17/34]
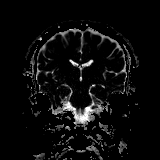
[im 34/34]
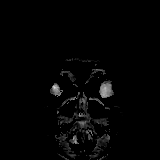

[Series 10: T2 · axial · 4.0mm · 0.36mm/px · z∈[-33,+117]mm · 2 of 30 slices shown (1 of 2)]
[im 1/30]
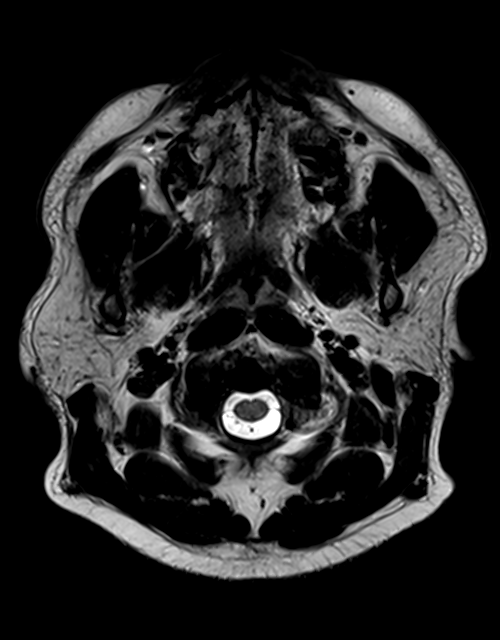
[im 30/30]
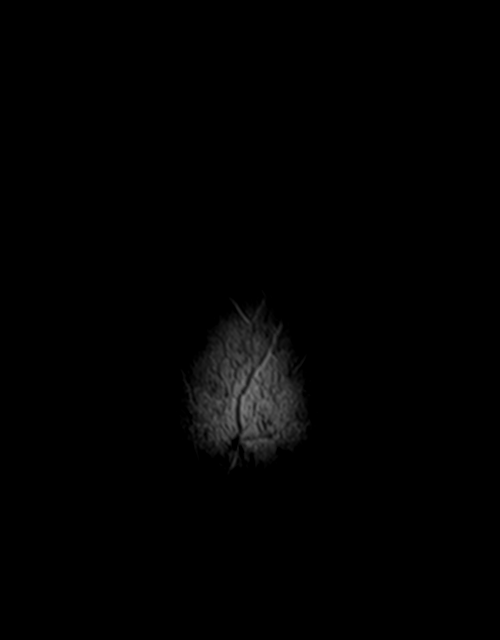

[Series 11: FLAIR · axial · 3.0mm · 0.72mm/px · z∈[-35,+120]mm · 2 of 27 slices shown]
[im 1/27]
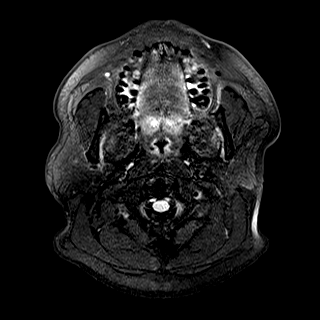
[im 27/27]
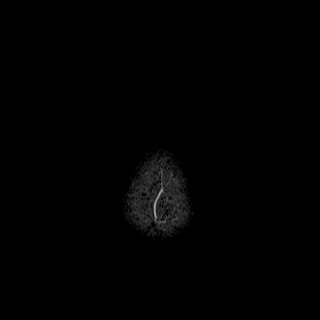

[Series 13: swi_images · axial · 1.5mm · 0.90mm/px · z∈[-35,+119]mm · 8 of 104 slices shown]
[im 1/104]
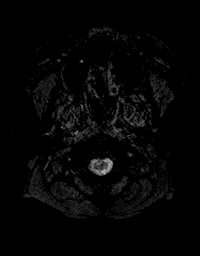
[im 15/104]
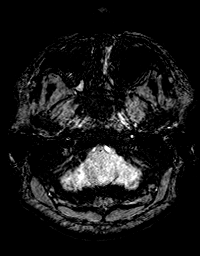
[im 30/104]
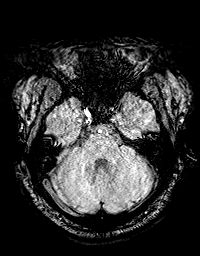
[im 45/104]
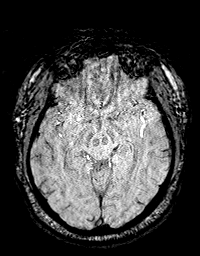
[im 59/104]
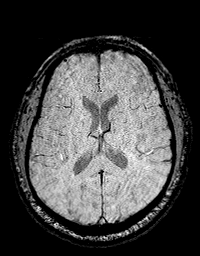
[im 74/104]
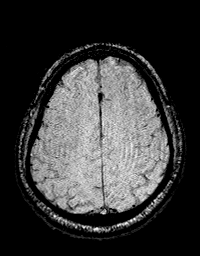
[im 89/104]
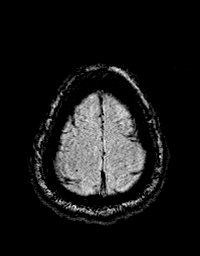
[im 104/104]
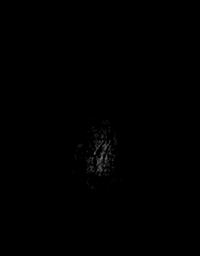

[Series 14: T1 · axial · 1.0mm · 0.90mm/px · z∈[-37,+121]mm · 12 of 160 slices shown (2 of 2)]
[im 1/160]
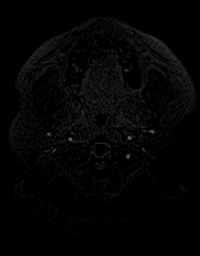
[im 15/160]
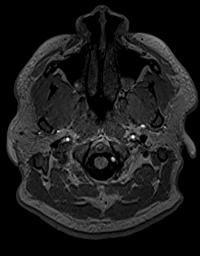
[im 29/160]
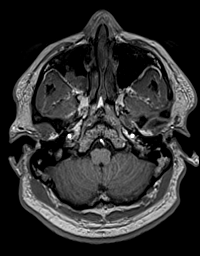
[im 44/160]
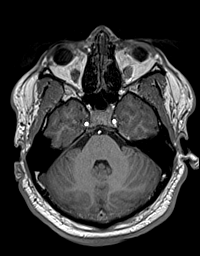
[im 58/160]
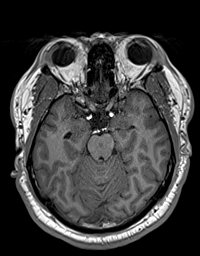
[im 73/160]
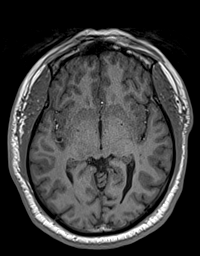
[im 87/160]
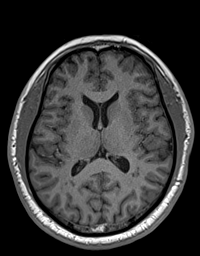
[im 102/160]
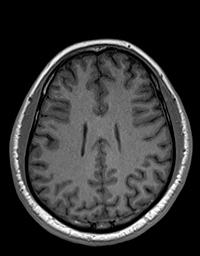
[im 116/160]
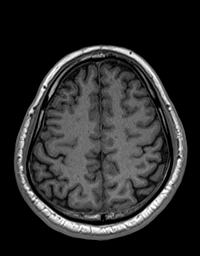
[im 131/160]
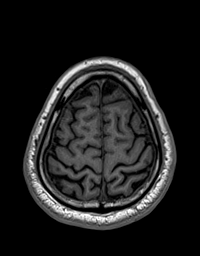
[im 145/160]
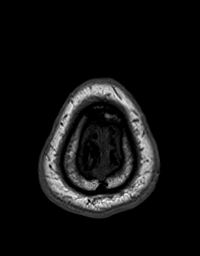
[im 160/160]
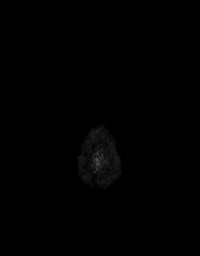

[Series 15: T2 · coronal · 4.0mm · 0.36mm/px · 3 of 39 slices shown (2 of 2)]
[im 1/39]
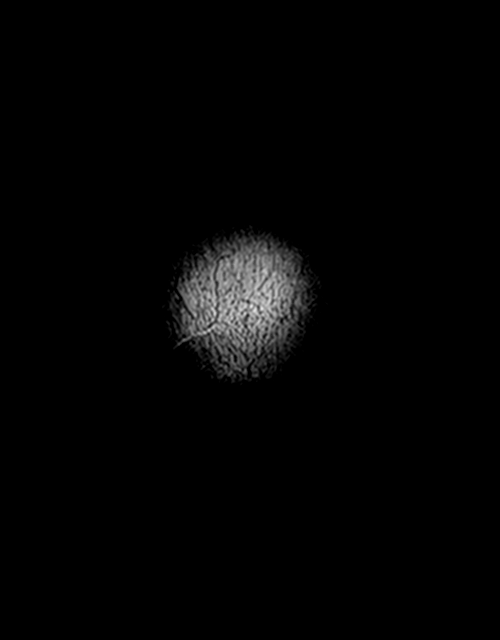
[im 20/39]
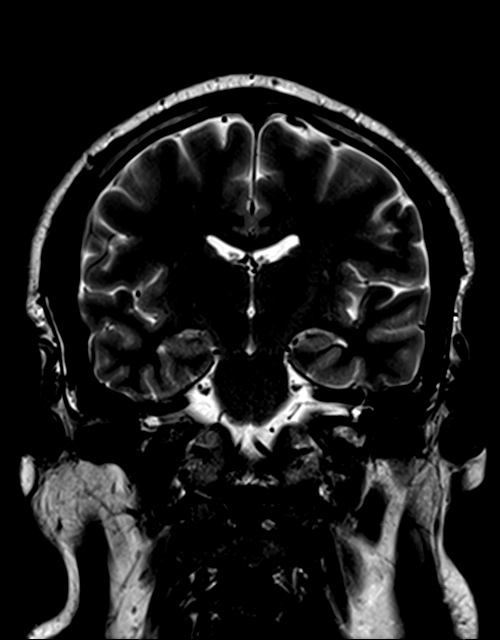
[im 39/39]
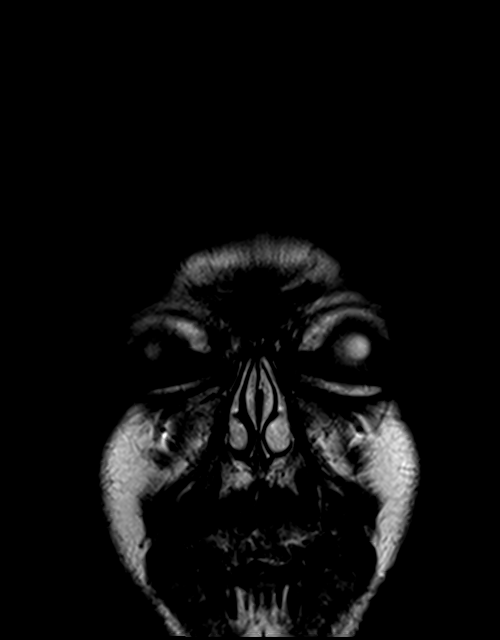

[48 of 48 positions shown; findings below may reference images not displayed]

FINDINGS: MRI HEAD FINDINGS

Brain: There is no evidence of acute infarct, intracranial
hemorrhage, mass, midline shift, or extra-axial fluid collection.
Scattered small foci of T2 hyperintensity in the cerebral white
matter are most notable in the periatrial regions and are unchanged
from the prior MRI. The brainstem and cerebellum are unremarkable.

Vascular: Major intracranial vascular flow voids are preserved.

Skull and upper cervical spine: Unremarkable bone marrow signal.

Sinuses/Orbits: Small mucous retention cysts in the maxillary
sinuses, unchanged. Clear mastoid air cells.

Other: None.

MRA HEAD FINDINGS

The visualized distal vertebral arteries are widely patent to the
basilar with the left being strongly dominant. Patent right PICA,
bilateral AICAs, and bilateral SCAs are identified. The basilar
artery is widely patent. There are left larger than right posterior
communicating arteries. Both PCAs are patent without evidence of
significant stenosis.

The internal carotid arteries are widely patent from skull base to
carotid termini. An infundibulum is noted at the left posterior
communicating artery origin. ACAs and MCAs are patent without
evidence of proximal branch occlusion or significant stenosis. No
aneurysm is identified.
IMPRESSION: 1. No acute intracranial abnormality.
2. Unchanged scattered cerebral white matter T2 hyperintensities,
nonspecific though may reflect migraines, prior
infection/inflammation, vasculitis, or less likely demyelinating
disease.

## 2019-08-02 ENCOUNTER — Telehealth: Payer: Self-pay

## 2019-08-02 ENCOUNTER — Ambulatory Visit (HOSPITAL_COMMUNITY)
Admission: EM | Admit: 2019-08-02 | Discharge: 2019-08-02 | Disposition: A | Discharge status: Home / Self Care | Payer: Self-pay | Attending: Family Medicine | Admitting: Family Medicine

## 2019-08-02 ENCOUNTER — Other Ambulatory Visit: Payer: Self-pay

## 2019-08-02 ENCOUNTER — Encounter (HOSPITAL_COMMUNITY): Payer: Self-pay

## 2019-08-02 DIAGNOSIS — R112 Nausea with vomiting, unspecified: Secondary | ICD-10-CM

## 2019-08-02 DIAGNOSIS — Z1159 Encounter for screening for other viral diseases: Secondary | ICD-10-CM

## 2019-08-02 DIAGNOSIS — Z20828 Contact with and (suspected) exposure to other viral communicable diseases: Secondary | ICD-10-CM | POA: Insufficient documentation

## 2019-08-02 DIAGNOSIS — J069 Acute upper respiratory infection, unspecified: Secondary | ICD-10-CM

## 2019-08-02 MED ORDER — ONDANSETRON 4 MG PO TBDP: 4.0000 mg | ORAL_TABLET | Freq: Three times a day (TID) | ORAL | 0 refills | Status: DC | PRN

## 2019-08-02 MED ORDER — BENZONATATE 200 MG PO CAPS: 200.0000 mg | ORAL_CAPSULE | Freq: Three times a day (TID) | ORAL | 0 refills | Status: AC | PRN

## 2019-08-02 MED ORDER — CETIRIZINE HCL 10 MG PO CAPS: 10.0000 mg | ORAL_CAPSULE | Freq: Every day | ORAL | 0 refills | Status: DC

## 2019-08-02 MED ORDER — DM-GUAIFENESIN ER 30-600 MG PO TB12: 1.0000 | ORAL_TABLET | Freq: Two times a day (BID) | ORAL | 0 refills | Status: DC

## 2019-08-02 NOTE — ED Triage Notes (Signed)
Pt presents to UC w/ c/o upset stomach, diarrhea, cough, congestion, "hot and cold" x2 days.

## 2019-08-02 NOTE — Discharge Instructions (Signed)
Covid swab pending, we will call if positive Please begin daily cetirizine to help with congestion and drainage Mucinex DM twice daily to help with congestion and cough May also try Tessalon every 8 hours as needed for cough Zofran for nausea and vomiting May try Imodium or Pepto-Bismol to help slow down bowel movements Please push fluids, make it Pedialyte or Gatorade if continuing to have a lot of diarrhea  Please follow-up if symptoms not resolving, worsening, developing dizziness, weakness, shortness of breath, difficulty breathing

## 2019-08-03 NOTE — ED Provider Notes (Signed)
[facility]    CSN: [id number] Arrival date & time: 08/02/19  1527      History   Chief Complaint Chief Complaint  Patient presents with  . NVD, congestion, cough    HPI Joshua Spencer is a 38 y.o. male history of migraines presenting today for evaluation of nausea vomiting diarrhea as well as URI symptoms.  Patient states that for the past 2 days he had has had an upset stomach.  He has had frequent bowel movements as well as nausea.  He has had a few episodes of vomiting, but has been able to tolerate liquids at times.  He has also had cough, congestion and hot and cold chills.  He has had subjective fevers, but no measured fevers.  Initially thought symptoms were a stomach bug.  Denies chest pain or shortness of breath.  Has had some occasional stomach discomfort associated with diarrhea, but this comes and goes.  Denies blood in vomit or stool.  Denies known exposure to COVID or close sick contacts.  HPI  Past Medical History:  Diagnosis Date  . Migraines     There are no active problems to display for this patient.   History reviewed. No pertinent surgical history.     Home Medications    Prior to Admission medications   Medication Sig Start Date End Date Taking? Authorizing Provider  benzonatate (TESSALON) 200 MG capsule Take 1 capsule (200 mg total) by mouth 3 (three) times daily as needed for up to 7 days for cough. 08/02/19 08/09/19  Giancarlos Berendt C, PA-C  Cetirizine HCl 10 MG CAPS Take 1 capsule (10 mg total) by mouth daily for 10 days. 08/02/19 08/12/19  Domenic Schoenberger C, PA-C  dextromethorphan-guaiFENesin (MUCINEX DM) 30-600 MG 12hr tablet Take 1 tablet by mouth 2 (two) times daily. 08/02/19   Ingri Diemer C, PA-C  naproxen (NAPROSYN) 500 MG tablet Take 1 tablet (500 mg total) by mouth every 12 (twelve) hours as needed. 08/25/18   Everlena Cooper, Adam R, DO  ondansetron (ZOFRAN ODT) 4 MG disintegrating tablet Take 1 tablet (4 mg total) by mouth every 8 (eight)  hours as needed for nausea or vomiting. 08/02/19   Tahira Olivarez C, PA-C  rizatriptan (MAXALT-MLT) 10 MG disintegrating tablet Take 1 tablet (10 mg total) by mouth as needed for migraine. May repeat in 2 hours if needed. Maximum 2 tablets in 24 hours 12/17/18 08/02/19  Drema Dallas, DO  topiramate (TOPAMAX) 50 MG tablet Take 1 tablet (50 mg total) by mouth daily. For 1 week, then increase to 100 mg. 10/28/18 08/02/19  Drema Dallas, DO    Family History History reviewed. No pertinent family history.  Social History Social History   Tobacco Use  . Smoking status: Never Smoker  . Smokeless tobacco: Never Used  Substance Use Topics  . Alcohol use: No  . Drug use: No     Allergies   Patient has no known allergies.   Review of Systems Review of Systems  Constitutional: Positive for chills and fever. Negative for activity change, appetite change and fatigue.  HENT: Positive for congestion and rhinorrhea. Negative for ear pain, sinus pressure, sore throat and trouble swallowing.   Eyes: Negative for discharge and redness.  Respiratory: Positive for cough. Negative for chest tightness and shortness of breath.   Cardiovascular: Negative for chest pain.  Gastrointestinal: Positive for diarrhea, nausea and vomiting. Negative for abdominal pain.  Musculoskeletal: Negative for myalgias.  Skin: Negative for rash.  Neurological:  Negative for dizziness, light-headedness and headaches.     Physical Exam Triage Vital Signs ED Triage Vitals  Enc Vitals Group     BP 08/02/19 1631 (!) 153/89     Pulse Rate 08/02/19 1631 74     Resp 08/02/19 1631 16     Temp 08/02/19 1631 (!) 97.3 F (36.3 C)     Temp Source 08/02/19 1631 Tympanic     SpO2 08/02/19 1631 97 %     Weight --      Height --      Head Circumference --      Peak Flow --      Pain Score 08/02/19 1634 0     Pain Loc --      Pain Edu? --      Excl. in GC? --    No data found.  Updated Vital Signs BP (!) 153/89 (BP  Location: Left Arm)   Pulse 74   Temp (!) 97.3 F (36.3 C) (Tympanic)   Resp 16   SpO2 97%   Visual Acuity Right Eye Distance:   Left Eye Distance:   Bilateral Distance:    Right Eye Near:   Left Eye Near:    Bilateral Near:     Physical Exam Vitals signs and nursing note reviewed.  Constitutional:      Appearance: He is well-developed.     Comments: No acute distress  HENT:     Head: Normocephalic and atraumatic.     Ears:     Comments: Bilateral ears without tenderness to palpation of external auricle, tragus and mastoid, EAC's without erythema or swelling, TM's with good bony landmarks and cone of light. Non erythematous.     Mouth/Throat:     Comments: Oral mucosa pink and moist, no tonsillar enlargement or exudate. Posterior pharynx patent and nonerythematous, no uvula deviation or swelling. Normal phonation.  Eyes:     Conjunctiva/sclera: Conjunctivae normal.  Neck:     Musculoskeletal: Neck supple.  Cardiovascular:     Rate and Rhythm: Normal rate and regular rhythm.     Heart sounds: No murmur.  Pulmonary:     Effort: Pulmonary effort is normal. No respiratory distress.     Breath sounds: Normal breath sounds.     Comments: Breathing comfortably at rest, CTABL, no wheezing, rales or other adventitious sounds auscultated Abdominal:     Palpations: Abdomen is soft.     Tenderness: There is no abdominal tenderness.     Comments: Abdomen slightly distended, but nontender to light deep palpation throughout entire abdomen  Skin:    General: Skin is warm and dry.  Neurological:     Mental Status: He is alert.      UC Treatments / Results  Labs (all labs ordered are listed, but only abnormal results are displayed) Labs Reviewed  NOVEL CORONAVIRUS, NAA (HOSP ORDER, SEND-OUT TO REF LAB; TAT 18-24 HRS)    EKG   Radiology No results found.  Procedures Procedures (including critical care time)  Medications Ordered in UC Medications - No data to display   Initial Impression / Assessment and Plan / UC Course  I have reviewed the triage vital signs and the nursing notes.  Pertinent labs & imaging results that were available during my care of the patient were reviewed by me and considered in my medical decision making (see chart for details).     COVID swab pending.  Patient was combination of URI symptoms and GI symptoms.  Negative peritoneal  signs.  Do not suspect abdominal emergency at this time.  Most likely viral etiology.  Recommending symptomatic and supportive care, pushing fluids.  Vital signs stable, stable for discharge.  Continue to monitor,Discussed strict return precautions. Patient verbalized understanding and is agreeable with plan.  Final Clinical Impressions(s) / UC Diagnoses   Final diagnoses:  Viral URI with cough     Discharge Instructions     Covid swab pending, we will call if positive Please begin daily cetirizine to help with congestion and drainage Mucinex DM twice daily to help with congestion and cough May also try Tessalon every 8 hours as needed for cough Zofran for nausea and vomiting May try Imodium or Pepto-Bismol to help slow down bowel movements Please push fluids, make it Pedialyte or Gatorade if continuing to have a lot of diarrhea  Please follow-up if symptoms not resolving, worsening, developing dizziness, weakness, shortness of breath, difficulty breathing   ED Prescriptions    Medication Sig Dispense Auth. Provider   ondansetron (ZOFRAN ODT) 4 MG disintegrating tablet Take 1 tablet (4 mg total) by mouth every 8 (eight) hours as needed for nausea or vomiting. 20 tablet Draden Cottingham C, PA-C   Cetirizine HCl 10 MG CAPS Take 1 capsule (10 mg total) by mouth daily for 10 days. 10 capsule Mcarthur Ivins C, PA-C   benzonatate (TESSALON) 200 MG capsule Take 1 capsule (200 mg total) by mouth 3 (three) times daily as needed for up to 7 days for cough. 28 capsule Anisa Leanos C, PA-C    dextromethorphan-guaiFENesin (MUCINEX DM) 30-600 MG 12hr tablet Take 1 tablet by mouth 2 (two) times daily. 20 tablet Chonita Gadea, [place] C, PA-C     PDMP not reviewed this encounter.   Janith Lima, [place] 08/03/19 [phone]

## 2019-08-05 LAB — NOVEL CORONAVIRUS, NAA (HOSP ORDER, SEND-OUT TO REF LAB; TAT 18-24 HRS): SARS-CoV-2, NAA: NOT DETECTED

## 2019-08-17 ENCOUNTER — Ambulatory Visit (INDEPENDENT_AMBULATORY_CARE_PROVIDER_SITE_OTHER): Payer: Self-pay

## 2019-08-17 ENCOUNTER — Encounter (HOSPITAL_COMMUNITY): Payer: Self-pay

## 2019-08-17 ENCOUNTER — Other Ambulatory Visit: Payer: Self-pay

## 2019-08-17 ENCOUNTER — Ambulatory Visit (HOSPITAL_COMMUNITY)
Admission: EM | Admit: 2019-08-17 | Discharge: 2019-08-17 | Disposition: A | Discharge status: Home / Self Care | Payer: Self-pay | Attending: Emergency Medicine | Admitting: Emergency Medicine

## 2019-08-17 DIAGNOSIS — R0781 Pleurodynia: Secondary | ICD-10-CM

## 2019-08-17 MED ORDER — TIZANIDINE HCL 4 MG PO TABS: 4.0000 mg | ORAL_TABLET | Freq: Three times a day (TID) | ORAL | 0 refills | Status: AC | PRN

## 2019-08-17 MED ORDER — IBUPROFEN 600 MG PO TABS: 600.0000 mg | ORAL_TABLET | Freq: Four times a day (QID) | ORAL | 0 refills | Status: AC | PRN

## 2019-08-17 NOTE — ED Triage Notes (Signed)
Pt presents to UC stating he was seen her for left side pain 2 weeks ago. Pt states the pain is worse now than it was then and it is painful upon movement.

## 2019-08-17 NOTE — Discharge Instructions (Addendum)
Your x-ray was negative for fracture, collapsed lung or pneumonia.  I suspect that you have strained the muscles in between the ribs.  600 mg of ibuprofen combined with 1 g of Tylenol 3-4 times a day as needed for pain.  Zanaflex for muscle soreness and spasms.

## 2019-08-17 NOTE — ED Provider Notes (Signed)
HPI  SUBJECTIVE:  Joshua Spencer is a 38 y.o. male who presents with left lower rib pain in the midaxillary line described as stabbing for the past 6 days.  Patient states he feels as if he is "being stabbed by his ribs".  It is present only with laughing, sneezing, coughing, torso rotation.  It is not present at any other time.  Patient was seen here on 10/12 for nausea vomiting diarrhea cough congestion hot and cold chills.  No record of left-sided pain.  Covid test was negative.  Patient states that he was doing a lot of coughing.  He denies trauma to the chest, abdominal pain.  No sore throat, fevers, chest pain or shortness of breath, coughing, wheezing.  Had a normal bowel movement today.  No bruising, rash.  No urinary complaints.  He has never had symptoms like this before.  No antipyretic in the past 4 to 6 hours.  He tried rest without improvement in his symptoms.  Symptoms are worse with coughing, sneezing, laughing, torso rotation.  Past medical history negative for asthma, emphysema, COPD, smoking, pneumothorax, pneumonia, PE, diabetes, hypertension.  WLN:[id number], Cheri Rous, MD   Past Medical History:  Diagnosis Date  . Migraines     History reviewed. No pertinent surgical history.  Family History  Problem Relation Age of Onset  . Healthy Mother   . Healthy Father     Social History   Tobacco Use  . Smoking status: Never Smoker  . Smokeless tobacco: Never Used  Substance Use Topics  . Alcohol use: No  . Drug use: No    No current facility-administered medications for this encounter.   Current Outpatient Medications:  .  Cetirizine HCl 10 MG CAPS, Take 1 capsule (10 mg total) by mouth daily for 10 days., Disp: 10 capsule, Rfl: 0 .  ibuprofen (ADVIL) 600 MG tablet, Take 1 tablet (600 mg total) by mouth every 6 (six) hours as needed., Disp: 30 tablet, Rfl: 0 .  ondansetron (ZOFRAN ODT) 4 MG disintegrating tablet, Take 1 tablet (4 mg total) by mouth every 8 (eight) hours as  needed for nausea or vomiting., Disp: 20 tablet, Rfl: 0 .  tiZANidine (ZANAFLEX) 4 MG tablet, Take 1 tablet (4 mg total) by mouth every 8 (eight) hours as needed for muscle spasms., Disp: 30 tablet, Rfl: 0  No Known Allergies   ROS  As noted in HPI.   Physical Exam  BP (!) 149/94 (BP Location: Right Arm)   Pulse 65   Temp 97.7 F (36.5 C) (Tympanic)   Resp 16   SpO2 95%   Constitutional: Well developed, well nourished, no acute distress Eyes:  EOMI, conjunctiva normal bilaterally HENT: Normocephalic, atraumatic,mucus membranes moist Respiratory: Normal inspiratory effort, lungs clear bilaterally.  Good air movement.  Positive tenderness left lower ribs in the midaxillary line.  No bruising, rash, crepitus. Cardiovascular: Normal rate, regular rhythm no murmurs rubs or gallops GI: nondistended skin: No rash, skin intact Musculoskeletal: no deformities Neurologic: Alert & oriented x 3, no focal neuro deficits Psychiatric: Speech and behavior appropriate   ED Course   Medications - No data to display  Orders Placed This Encounter  Procedures  . DG Ribs Unilateral W/Chest Left    Standing Status:   Standing    Number of Occurrences:   1    Order Specific Question:   Reason for Exam (SYMPTOM  OR DIAGNOSIS REQUIRED)    Answer:   tenderness lower ribs midaxillary line r/o pna ptx  displaced rib fx    No results found for this or any previous visit (from the past 24 hour(s)). Dg Ribs Unilateral W/chest Left  Result Date: 08/17/2019 CLINICAL DATA:  Left-sided chest pain for 2 weeks. EXAM: LEFT RIBS AND CHEST - 3+ VIEW COMPARISON:  Chest radiograph January 23, 2017 FINDINGS: Lungs are clear. Heart size and pulmonary vascularity are normal. No adenopathy. There is no pneumothorax or pleural effusion. No evident rib fracture. IMPRESSION: No demonstrable rib fracture.  Lungs clear. Electronically Signed   By: Lowella Grip III M.D.   On: 08/17/2019 20:20    ED Clinical  Impression  1. Rib pain on left side      ED Assessment/Plan  Previous records reviewed.  As noted in HPI.  Reviewed imaging independently.  No obvious rib fractures.  No pneumothorax.  Lungs clear.  See radiology report for full details.  Presentation consistent with intercostal muscle strain/rib pain.  Sending home with Tylenol/ibuprofen combination, Zanaflex.  Follow-up with PMD as needed, to the ER if he gets worse.  Discussed imaging, MDM, treatment plan, and plan for follow-up with patient. Discussed sn/sx that should prompt return to the ED. patient agrees with plan.   Meds ordered this encounter  Medications  . ibuprofen (ADVIL) 600 MG tablet    Sig: Take 1 tablet (600 mg total) by mouth every 6 (six) hours as needed.    Dispense:  30 tablet    Refill:  0  . tiZANidine (ZANAFLEX) 4 MG tablet    Sig: Take 1 tablet (4 mg total) by mouth every 8 (eight) hours as needed for muscle spasms.    Dispense:  30 tablet    Refill:  0    *This clinic note was created using [employer]. Therefore, there may be occasional mistakes despite careful proofreading.   ?    Melynda Ripple, MD 08/17/19 2054

## 2020-10-25 ENCOUNTER — Ambulatory Visit (HOSPITAL_COMMUNITY)
Admission: EM | Admit: 2020-10-25 | Discharge: 2020-10-25 | Disposition: A | Discharge status: Home / Self Care | Payer: BC Managed Care – PPO | Attending: Emergency Medicine | Admitting: Emergency Medicine

## 2020-10-25 ENCOUNTER — Encounter (HOSPITAL_COMMUNITY): Payer: Self-pay

## 2020-10-25 ENCOUNTER — Ambulatory Visit (HOSPITAL_COMMUNITY)
Admission: EM | Admit: 2020-10-25 | Discharge: 2020-10-25 | Disposition: A | Discharge status: Home / Self Care | Payer: BC Managed Care – PPO | Attending: Internal Medicine | Admitting: Internal Medicine

## 2020-10-25 DIAGNOSIS — R062 Wheezing: Secondary | ICD-10-CM

## 2020-10-25 DIAGNOSIS — R6889 Other general symptoms and signs: Secondary | ICD-10-CM

## 2020-10-25 DIAGNOSIS — M940 Chondrocostal junction syndrome [Tietze]: Secondary | ICD-10-CM

## 2020-10-25 DIAGNOSIS — R519 Headache, unspecified: Secondary | ICD-10-CM | POA: Diagnosis present

## 2020-10-25 DIAGNOSIS — U071 COVID-19: Secondary | ICD-10-CM | POA: Insufficient documentation

## 2020-10-25 LAB — RESP PANEL BY RT-PCR (FLU A&B, COVID) ARPGX2
Influenza A by PCR: NEGATIVE
Influenza B by PCR: NEGATIVE
SARS Coronavirus 2 by RT PCR: POSITIVE — AB

## 2020-10-25 MED ORDER — PREDNISONE 50 MG PO TABS: ORAL_TABLET | ORAL | 0 refills | Status: DC

## 2020-10-25 MED ORDER — BENZONATATE 200 MG PO CAPS: 200.0000 mg | ORAL_CAPSULE | Freq: Two times a day (BID) | ORAL | 0 refills | Status: DC | PRN

## 2020-10-25 MED ORDER — OSELTAMIVIR PHOSPHATE 75 MG PO CAPS: 75.0000 mg | ORAL_CAPSULE | Freq: Two times a day (BID) | ORAL | 0 refills | Status: DC

## 2020-10-25 MED ORDER — ALBUTEROL SULFATE HFA 108 (90 BASE) MCG/ACT IN AERS: 2.0000 | INHALATION_SPRAY | RESPIRATORY_TRACT | 0 refills | Status: DC | PRN

## 2020-10-25 MED ORDER — ONDANSETRON 4 MG PO TBDP: 4.0000 mg | ORAL_TABLET | Freq: Once | ORAL | Status: DC

## 2020-10-25 MED ORDER — ONDANSETRON 4 MG PO TBDP: 4.0000 mg | ORAL_TABLET | Freq: Three times a day (TID) | ORAL | 0 refills | Status: DC | PRN

## 2020-10-25 NOTE — ED Notes (Signed)
No answer

## 2020-10-25 NOTE — Discharge Instructions (Addendum)
Your covid and flu test may not be back for 2 days, so in the mean time I am placing you an Tamiflu to cover influenza. Take Tylenol 1000 mg every 6 hours for headache and you may add Ibuprofen 800 mg every 8 hour between those doses.  You may take the following supplements to help your immune system be stronger to fight this viral infection Take Quarcetin 500 mg three times a day x 7 days with Zinc 50 mg ones a day x 7 days. The quarcetin is an antiviral and anti-inflammatory supplement which helps open the zinc channels in the cell to absorb Zinc. Zinc helps decrease the virus load in your body. Take Melatonin 6-10 mg at bed time which also helps support your immune system.  Also make sure to take Vit D 5,000 IU per day with a fatty meal and Vit C 5000 mg a day until you are completely better. To prevent viral illnesses your vitamin D should be between 60-80. Stay on Vitamin D 2,000  and C  1000 mg the rest of the season.  Don't lay around, keep active and walk as much as you are able to to prevent worsening of your symptoms.  Follow up with your family Dr next week.  If you get short of breath and you are able to check  your oxygen with a pulse oxygen meter, if it gets to 92% or less, you need to go to the hospital to be admitted. If you dont have one, come back here and we will assess you.

## 2020-10-25 NOTE — ED Triage Notes (Signed)
Pt presents with body aches, emesis, diarrhea, loss of taste and headaches X 2 days. Pt states he has taken ibuprofen with no relief.

## 2020-10-25 NOTE — ED Triage Notes (Addendum)
Pt in with c/o body aches emesis and diarrhea that started yesterday  Pt has been taking ibuprofen for sxs with no relief  States he is not able to keep anything down

## 2020-10-25 NOTE — ED Notes (Signed)
Called and left a message that room was ready

## 2020-10-25 NOTE — ED Notes (Signed)
No answer when called in lobby for room assignment. Left message on phone for pt to call [facility].

## 2020-10-25 NOTE — ED Provider Notes (Signed)
[facility]    CSN: [id number] Arrival date & time: 10/25/20  1936      History   Chief Complaint Chief Complaint  Patient presents with  . Generalized Body Aches  . Emesis  . Diarrhea  . Headache    HPI Joshua Spencer is a 40 y.o. male who presents with onset of body aches, vomiting x 1, diarrhea x 3 today, only ones today, loss or taste and HA x 2 days. Ibuprofen is not helping the HA. Denies SOB. Has been having chest pain on central chest, with and without coughing.     Past Medical History:  Diagnosis Date  . Migraines     There are no problems to display for this patient.   History reviewed. No pertinent surgical history.     Home Medications    Prior to Admission medications   Medication Sig Start Date End Date Taking? Authorizing Provider  albuterol (VENTOLIN HFA) 108 (90 Base) MCG/ACT inhaler Inhale 2 puffs into the lungs every 4 (four) hours as needed for wheezing or shortness of breath. 10/25/20  Yes Rodriguez-Southworth, Nettie Elm, PA-C  benzonatate (TESSALON) 200 MG capsule Take 1 capsule (200 mg total) by mouth 2 (two) times daily as needed for cough. 10/25/20  Yes Rodriguez-Southworth, Nettie Elm, PA-C  oseltamivir (TAMIFLU) 75 MG capsule Take 1 capsule (75 mg total) by mouth every 12 (twelve) hours. 10/25/20  Yes Rodriguez-Southworth, Nettie Elm, PA-C  predniSONE (DELTASONE) 50 MG tablet For wheezing 10/25/20  Yes Rodriguez-Southworth, Nettie Elm, PA-C  Cetirizine HCl 10 MG CAPS Take 1 capsule (10 mg total) by mouth daily for 10 days. 08/02/19 08/12/19  Wieters, Hallie C, PA-C  ibuprofen (ADVIL) 600 MG tablet Take 1 tablet (600 mg total) by mouth every 6 (six) hours as needed. 08/17/19   Domenick Gong, MD  ondansetron (ZOFRAN ODT) 4 MG disintegrating tablet Take 1 tablet (4 mg total) by mouth every 8 (eight) hours as needed for nausea or vomiting. 10/25/20   Rodriguez-Southworth, Nettie Elm, PA-C  tiZANidine (ZANAFLEX) 4 MG tablet Take 1 tablet (4 mg total) by mouth every  8 (eight) hours as needed for muscle spasms. 08/17/19   Domenick Gong, MD  rizatriptan (MAXALT-MLT) 10 MG disintegrating tablet Take 1 tablet (10 mg total) by mouth as needed for migraine. May repeat in 2 hours if needed. Maximum 2 tablets in 24 hours 12/17/18 08/02/19  Drema Dallas, DO  topiramate (TOPAMAX) 50 MG tablet Take 1 tablet (50 mg total) by mouth daily. For 1 week, then increase to 100 mg. 10/28/18 08/02/19  Drema Dallas, DO    Family History Family History  Problem Relation Age of Onset  . Healthy Mother   . Healthy Father     Social History Social History   Tobacco Use  . Smoking status: Never Smoker  . Smokeless tobacco: Never Used  Vaping Use  . Vaping Use: Never used  Substance Use Topics  . Alcohol use: No  . Drug use: No     Allergies   Patient has no known allergies.   Review of Systems Review of Systems   Physical Exam Triage Vital Signs ED Triage Vitals  Enc Vitals Group     BP 10/25/20 2016 119/78     Pulse Rate 10/25/20 2016 85     Resp 10/25/20 2016 18     Temp 10/25/20 2016 99.7 F (37.6 C)     Temp Source 10/25/20 2016 Oral     SpO2 10/25/20 2016 98 %  Weight --      Height --      Head Circumference --      Peak Flow --      Pain Score 10/25/20 2015 8     Pain Loc --      Pain Edu? --      Excl. in [place]? --    No data found.  Updated Vital Signs BP 119/78 (BP Location: Left Arm)   Pulse 85   Temp 99.7 F (37.6 C) (Oral)   Resp 18   SpO2 98%   Visual Acuity Right Eye Distance:   Left Eye Distance:   Bilateral Distance:    Right Eye Near:   Left Eye Near:    Bilateral Near:     Physical Exam Vitals and nursing note reviewed.  Constitutional:      General: He is not in acute distress.    Appearance: He is normal weight. He is not toxic-appearing.  HENT:     Head: Normocephalic.     Right Ear: Tympanic membrane, ear canal and external ear normal.     Left Ear: Tympanic membrane, ear canal and external ear  normal.     Nose: Congestion and rhinorrhea present.     Mouth/Throat:     Mouth: Mucous membranes are moist.     Pharynx: Oropharynx is clear.  Eyes:     General: No scleral icterus.    Extraocular Movements: Extraocular movements intact.     Conjunctiva/sclera: Conjunctivae normal.  Cardiovascular:     Rate and Rhythm: Normal rate and regular rhythm.     Heart sounds: No murmur heard.   Pulmonary:     Effort: Pulmonary effort is normal.     Comments: Has faint wheezing on LUL, the rest is clear. I was able to reproduce the chest pain he has been having with palpation on central sternal area.  Chest:     Chest wall: Tenderness present.  Abdominal:     General: Bowel sounds are normal.     Palpations: Abdomen is soft. There is no mass.     Tenderness: There is no abdominal tenderness. There is no guarding or rebound.  Musculoskeletal:        General: Normal range of motion.     Cervical back: Neck supple. No rigidity.  Lymphadenopathy:     Cervical: No cervical adenopathy.  Skin:    General: Skin is warm and dry.     Findings: No rash.  Neurological:     Mental Status: He is alert and oriented to person, place, and time.     Gait: Gait normal.  Psychiatric:        Mood and Affect: Mood normal.        Behavior: Behavior normal.        Thought Content: Thought content normal.        Judgment: Judgment normal.      UC Treatments / Results  Labs (all labs ordered are listed, but only abnormal results are displayed) Labs Reviewed  RESP PANEL BY RT-PCR (FLU A&B, COVID) [id number]    EKG   Radiology No results found.  Procedures Procedures (including critical care time)  Medications Ordered in UC Medications - No data to display  Initial Impression / Assessment and Plan / UC Course  I have reviewed the triage vital signs and the nursing notes. Covid and Flu are pending and in the mean time I placed him on Tamiflu, Albuterol inhaler and prednisone for wheezing  and  Tessalon perless for cough. See instructions for supportive care.  Final Clinical Impressions(s) / UC Diagnoses   Final diagnoses:  Flu-like symptoms  Wheezing  Costochondritis     Discharge Instructions     Your covid and flu test may not be back for 2 days, so in the mean time I am placing you an Tamiflu to cover influenza. Take Tylenol 1000 mg every 6 hours for headache and you may add Ibuprofen 800 mg every 8 hour between those doses.  You may take the following supplements to help your immune system be stronger to fight this viral infection Take Quarcetin 500 mg three times a day x 7 days with Zinc 50 mg ones a day x 7 days. The quarcetin is an antiviral and anti-inflammatory supplement which helps open the zinc channels in the cell to absorb Zinc. Zinc helps decrease the virus load in your body. Take Melatonin 6-10 mg at bed time which also helps support your immune system.  Also make sure to take Vit D 5,000 IU per day with a fatty meal and Vit C 5000 mg a day until you are completely better. To prevent viral illnesses your vitamin D should be between 60-80. Stay on Vitamin D 2,000  and C  1000 mg the rest of the season.  Don't lay around, keep active and walk as much as you are able to to prevent worsening of your symptoms.  Follow up with your family Dr next week.  If you get short of breath and you are able to check  your oxygen with a pulse oxygen meter, if it gets to 92% or less, you need to go to the hospital to be admitted. If you dont have one, come back here and we will assess you.      ED Prescriptions    Medication Sig Dispense Auth. Provider   ondansetron (ZOFRAN ODT) 4 MG disintegrating tablet Take 1 tablet (4 mg total) by mouth every 8 (eight) hours as needed for nausea or vomiting. 20 tablet Rodriguez-Southworth, Nettie Elm, PA-C   oseltamivir (TAMIFLU) 75 MG capsule Take 1 capsule (75 mg total) by mouth every 12 (twelve) hours. 10 capsule Rodriguez-Southworth, Nettie Elm,  PA-C   predniSONE (DELTASONE) 50 MG tablet For wheezing 5 tablet Rodriguez-Southworth, Nettie Elm, PA-C   albuterol (VENTOLIN HFA) 108 (90 Base) MCG/ACT inhaler Inhale 2 puffs into the lungs every 4 (four) hours as needed for wheezing or shortness of breath. 18 g Rodriguez-Southworth, Dennisse Swader, PA-C   benzonatate (TESSALON) 200 MG capsule Take 1 capsule (200 mg total) by mouth 2 (two) times daily as needed for cough. 30 capsule Rodriguez-Southworth, Nettie Elm, PA-C     PDMP not reviewed this encounter.   Garey Ham, Cordelia Poche 10/25/20 2100

## 2020-10-26 ENCOUNTER — Telehealth (HOSPITAL_COMMUNITY): Payer: Self-pay | Admitting: Emergency Medicine

## 2020-10-26 NOTE — Telephone Encounter (Signed)
Spoke to pharmacist at [facility] [phone].  Pharmacy seeking clarification of prednisone tablets .   Spoke to [employer], np, prednisone 50 mg 5 tablets, one tablet each day until gone.  Gave message to pharmacy.

## 2021-01-11 ENCOUNTER — Other Ambulatory Visit: Payer: Self-pay

## 2021-01-11 ENCOUNTER — Encounter (HOSPITAL_COMMUNITY): Payer: Self-pay

## 2021-01-11 ENCOUNTER — Ambulatory Visit (HOSPITAL_COMMUNITY)
Admission: EM | Admit: 2021-01-11 | Discharge: 2021-01-11 | Disposition: A | Discharge status: Home / Self Care | Payer: BC Managed Care – PPO | Attending: Family Medicine | Admitting: Family Medicine

## 2021-01-11 DIAGNOSIS — R112 Nausea with vomiting, unspecified: Secondary | ICD-10-CM

## 2021-01-11 DIAGNOSIS — A084 Viral intestinal infection, unspecified: Secondary | ICD-10-CM | POA: Diagnosis not present

## 2021-01-11 MED ORDER — ONDANSETRON HCL 4 MG PO TABS: 4.0000 mg | ORAL_TABLET | Freq: Four times a day (QID) | ORAL | 0 refills | Status: AC

## 2021-01-11 MED ORDER — FAMOTIDINE 20 MG PO TABS: 20.0000 mg | ORAL_TABLET | Freq: Two times a day (BID) | ORAL | 0 refills | Status: DC | PRN

## 2021-01-11 NOTE — Discharge Instructions (Addendum)
Treating for viral gastroenteritis and symptoms of nausea and vomiting.  Start Pepcid 20 mg twice daily prior to meals until nausea and vomiting resolved.  For acute nausea or vomiting I have prescribed you Zofran place 1 tablet under your tongue about to dissolve avoid drinking or eating for 5 minutes after tablet dissolved. If you develop any fever or severe abdominal pain go immediately to the emergency department.  In review of the system your last Covid positive test was 10/25/2020 therefore per it has not been greater than 3 months and you do not need to be retested for Covid.

## 2021-01-11 NOTE — ED Triage Notes (Signed)
Pt presents with nausea and vomiting X 2 days.  

## 2021-01-11 NOTE — ED Provider Notes (Signed)
[facility]    CSN: [id number] Arrival date & time: 01/11/21  1126      History   Chief Complaint Chief Complaint  Patient presents with  . Nausea  . Emesis    HPI Joshua Spencer is a 40 y.o. male.   HPI Patient presents for 2 days of nausea with vomiting.  Patient reports having at least 3 episodes of vomitus.  He is only able to tolerate at present orange juice.  He is unaware of any sick contacts.  He has a history of chronic migraines however denies any associated nausea and vomiting with the symptoms.  He initially took a probiotic which seemed to help symptoms temporarily.   Past Medical History:  Diagnosis Date  . Migraines     There are no problems to display for this patient.   History reviewed. No pertinent surgical history.     Home Medications    Prior to Admission medications   Medication Sig Start Date End Date Taking? Authorizing Provider  albuterol (VENTOLIN HFA) 108 (90 Base) MCG/ACT inhaler Inhale 2 puffs into the lungs every 4 (four) hours as needed for wheezing or shortness of breath. 10/25/20   Rodriguez-Southworth, Nettie Elm, PA-C  benzonatate (TESSALON) 200 MG capsule Take 1 capsule (200 mg total) by mouth 2 (two) times daily as needed for cough. 10/25/20   Rodriguez-Southworth, Nettie Elm, PA-C  Cetirizine HCl 10 MG CAPS Take 1 capsule (10 mg total) by mouth daily for 10 days. 08/02/19 08/12/19  Wieters, Hallie C, PA-C  ibuprofen (ADVIL) 600 MG tablet Take 1 tablet (600 mg total) by mouth every 6 (six) hours as needed. 08/17/19   Domenick Gong, MD  ondansetron (ZOFRAN ODT) 4 MG disintegrating tablet Take 1 tablet (4 mg total) by mouth every 8 (eight) hours as needed for nausea or vomiting. 10/25/20   Rodriguez-Southworth, Nettie Elm, PA-C  oseltamivir (TAMIFLU) 75 MG capsule Take 1 capsule (75 mg total) by mouth every 12 (twelve) hours. 10/25/20   Rodriguez-Southworth, Nettie Elm, PA-C  predniSONE (DELTASONE) 50 MG tablet For wheezing 10/25/20    Rodriguez-Southworth, Nettie Elm, PA-C  tiZANidine (ZANAFLEX) 4 MG tablet Take 1 tablet (4 mg total) by mouth every 8 (eight) hours as needed for muscle spasms. 08/17/19   Domenick Gong, MD  rizatriptan (MAXALT-MLT) 10 MG disintegrating tablet Take 1 tablet (10 mg total) by mouth as needed for migraine. May repeat in 2 hours if needed. Maximum 2 tablets in 24 hours 12/17/18 08/02/19  Drema Dallas, DO  topiramate (TOPAMAX) 50 MG tablet Take 1 tablet (50 mg total) by mouth daily. For 1 week, then increase to 100 mg. 10/28/18 08/02/19  Drema Dallas, DO    Family History Family History  Problem Relation Age of Onset  . Healthy Mother   . Healthy Father     Social History Social History   Tobacco Use  . Smoking status: Never Smoker  . Smokeless tobacco: Never Used  Vaping Use  . Vaping Use: Never used  Substance Use Topics  . Alcohol use: No  . Drug use: No     Allergies   Patient has no known allergies.   Review of Systems Review of Systems Pertinent negatives listed in HPI  Physical Exam Triage Vital Signs ED Triage Vitals  Enc Vitals Group     BP 01/11/21 1225 113/72     Pulse Rate 01/11/21 1225 91     Resp 01/11/21 1225 17     Temp 01/11/21 1225 98.2 F (36.8 C)  Temp Source 01/11/21 1225 Oral     SpO2 01/11/21 1225 94 %     Weight --      Height --      Head Circumference --      Peak Flow --      Pain Score 01/11/21 1227 1     Pain Loc --      Pain Edu? --      Excl. in [place]? --    No data found.  Updated Vital Signs BP 113/72 (BP Location: Right Arm)   Pulse 91   Temp 98.2 F (36.8 C) (Oral)   Resp 17   SpO2 94%   Visual Acuity Right Eye Distance:   Left Eye Distance:   Bilateral Distance:    Right Eye Near:   Left Eye Near:    Bilateral Near:     Physical Exam Constitutional:      Appearance: Normal appearance.  HENT:     Head: Normocephalic.  Cardiovascular:     Rate and Rhythm: Normal rate and regular rhythm.     Pulses: No  decreased pulses.     Heart sounds: Normal heart sounds.  Pulmonary:     Effort: Pulmonary effort is normal.     Breath sounds: Normal breath sounds.  Abdominal:     General: Abdomen is protuberant. Bowel sounds are decreased.     Palpations: Abdomen is soft.     Tenderness: There is abdominal tenderness in the right lower quadrant and left lower quadrant. There is no right CVA tenderness or left CVA tenderness.    Musculoskeletal:     Right lower leg: No edema.     Left lower leg: No edema.  Skin:    General: Skin is warm and dry.     Capillary Refill: Capillary refill takes less than 2 seconds.  Neurological:     General: No focal deficit present.     Mental Status: He is alert and oriented to person, place, and time.  Psychiatric:        Mood and Affect: Mood normal.        Behavior: Behavior normal.        Thought Content: Thought content normal.        Judgment: Judgment normal.      UC Treatments / Results  Labs (all labs ordered are listed, but only abnormal results are displayed) Labs Reviewed - No data to display  EKG   Radiology No results found.  Procedures Procedures (including critical care time)  Medications Ordered in UC Medications - No data to display  Initial Impression / Assessment and Plan / UC Course  I have reviewed the triage vital signs and the nursing notes.  Pertinent labs & imaging results that were available during my care of the patient were reviewed by me and considered in my medical decision making (see chart for details).      Nausea with vomitus x2 days suspect viral gastroenteritis.  Treating with Pepcid 20 mg twice daily prior to meals as needed until symptoms resolve and you are able to tolerate food.  Zofran as needed every 6 hours as needed.  ER precautions given.  Patient appears nontoxic therefore low suspicion for an acute abdomen.  Patient verbalized understanding agreement plan. Final Clinical Impressions(s) / UC  Diagnoses   Final diagnoses:  Intractable vomiting with nausea, unspecified vomiting type  Viral gastroenteritis     Discharge Instructions     Treating for viral gastroenteritis and symptoms  of nausea and vomiting.  Start Pepcid 20 mg twice daily prior to meals until nausea and vomiting resolved.  For acute nausea or vomiting I have prescribed you Zofran place 1 tablet under your tongue about to dissolve avoid drinking or eating for 5 minutes after tablet dissolved. If you develop any fever or severe abdominal pain go immediately to the emergency department.  In review of the system your last Covid positive test was 10/25/2020 therefore per it has not been greater than 3 months and you do not need to be retested for Covid.    ED Prescriptions    Medication Sig Dispense Auth. Provider   ondansetron (ZOFRAN) 4 MG tablet Take 1 tablet (4 mg total) by mouth every 6 (six) hours. 12 tablet Bing Neighbors, FNP   famotidine (PEPCID) 20 MG tablet Take 1 tablet (20 mg total) by mouth 2 (two) times daily as needed for heartburn or indigestion. 15 tablet Bing Neighbors, FNP     PDMP not reviewed this encounter.   Bing Neighbors, [place] 01/11/21 [phone]

## 2023-05-13 ENCOUNTER — Encounter (HOSPITAL_COMMUNITY): Payer: Self-pay | Admitting: *Deleted

## 2023-05-13 ENCOUNTER — Ambulatory Visit (HOSPITAL_COMMUNITY)
Admission: EM | Admit: 2023-05-13 | Discharge: 2023-05-13 | Disposition: A | Discharge status: Home / Self Care | Payer: BC Managed Care – PPO | Attending: Family Medicine | Admitting: Family Medicine

## 2023-05-13 DIAGNOSIS — S0501XA Injury of conjunctiva and corneal abrasion without foreign body, right eye, initial encounter: Secondary | ICD-10-CM

## 2023-05-13 MED ORDER — HYDROCODONE-ACETAMINOPHEN 5-325 MG PO TABS
1.0000 | ORAL_TABLET | Freq: Once | ORAL | Status: AC
  Administered 2023-05-13: 1 via ORAL

## 2023-05-13 MED ORDER — ERYTHROMYCIN 5 MG/GM OP OINT: TOPICAL_OINTMENT | OPHTHALMIC | 0 refills | Status: DC

## 2023-05-13 MED ORDER — FLUORESCEIN SODIUM 1 MG OP STRP
ORAL_STRIP | OPHTHALMIC | Status: AC
  Filled 2023-05-13: qty 1

## 2023-05-13 MED ORDER — TETRACAINE HCL 0.5 % OP SOLN
OPHTHALMIC | Status: AC
  Filled 2023-05-13: qty 4

## 2023-05-13 MED ORDER — HYDROCODONE-ACETAMINOPHEN 5-325 MG PO TABS
ORAL_TABLET | ORAL | Status: AC
  Filled 2023-05-13: qty 1

## 2023-05-13 MED ORDER — DICLOFENAC SODIUM 0.1 % OP SOLN: 1.0000 [drp] | Freq: Four times a day (QID) | OPHTHALMIC | 0 refills | Status: AC | PRN

## 2023-05-13 NOTE — ED Notes (Signed)
Notified dr hagler of patient's complaint

## 2023-05-13 NOTE — Discharge Instructions (Signed)
Call your ophthalmologist to ask if they can see you tomorrow.

## 2023-05-13 NOTE — ED Triage Notes (Addendum)
Pt states he can't see out of either eyes. He states that he has been to the eye doctor recently and he is going to see an eye specialist he might need a cornea replacement that appt isnt until 8/24. He states it has been worse since yesterday.

## 2023-05-14 NOTE — ED Provider Notes (Addendum)
[facility]   [id number] 05/13/23 Arrival Time: 1528  ASSESSMENT & PLAN:  1. Corneal abrasion, right, initial encounter    Meds ordered this encounter  Medications   erythromycin ophthalmic ointment    Sig: Place a 1/2 inch ribbon of ointment into the the affected eyelid 2-3 times daily for up to one week.    Dispense:  3.5 g    Refill:  0   diclofenac (VOLTAREN) 0.1 % ophthalmic solution    Sig: Place 1 drop into the right eye 4 (four) times daily as needed.    Dispense:  5 mL    Refill:  0   HYDROcodone-acetaminophen (NORCO/VICODIN) 5-325 MG per tablet 1 tablet   Feels Td is UTD. He plans on calling his ophthalmologist tomorrow am to arrange prompt f/u. ED if acute worsening.  [place] Controlled Substances Registry consulted for this patient. I feel the risk/benefit ratio today is favorable for proceeding with this prescription for a controlled substance. Medication sedation precautions given.  Reviewed expectations re: course of current medical issues. Questions answered. Outlined signs and symptoms indicating need for more acute intervention. Patient verbalized understanding. After Visit Summary given.   SUBJECTIVE:  Joshua Spencer is a 42 y.o. male who presents with complaint of RIGHT eye pain/swelling. Abrupt onset yest evening; ques pain after rubbing eye. Watery drainage now. With photophobia/trouble opening eye. No tx PTA. Has appt next month "with a cornea specialist for my left eye"; unable to elaborate.  OBJECTIVE:  Vitals:   05/13/23 1651  BP: (!) 145/94  Pulse: 69  Resp: 18  Temp: 98.2 F (36.8 C)  TempSrc: Oral  SpO2: 97%    General appearance: alert; no distress HEENT: ; AT; PERRLA; no restriction of the extraocular movements OD: with reported pain; with diffuse conjunctival injection; with watery drainage; with 2mm central corneal uptake on fluorescein exam; without gross corneal opacities; without limbal flush; without periorbital swelling or  erythema Neck: supple without LAD Lungs: clear to auscultation bilaterally; unlabored respirations Heart: regular rate and rhythm Skin: warm and dry Psychological: alert and cooperative; normal mood and affect   No Known Allergies  Past Medical History:  Diagnosis Date   Migraines    Social History   Socioeconomic History   Marital status: Single    Spouse name: Not on file   Number of children: 0   Years of education: Not on file   Highest education level: Not on file  Occupational History   Occupation: electronic associate    Employer: [id number]  Tobacco Use   Smoking status: Never   Smokeless tobacco: Never  Vaping Use   Vaping status: Never Used  Substance and Sexual Activity   Alcohol use: No   Drug use: No   Sexual activity: Not Currently  Other Topics Concern   Not on file  Social History Narrative   Lives with mom in a one story home.  No children.  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     Social Determinants of Health   Financial Resource Strain: Not on file  Food Insecurity: Not on file  Transportation Needs: Not on file  Physical Activity: Not on file  Stress: Not on file  Social Connections: Not on file  Intimate Partner Violence: Not on file   Family History  Problem Relation Age of Onset   Healthy Mother    Healthy Father    History reviewed. No pertinent surgical history.    Mardella Layman, MD  05/14/23 1533    Mardella Layman, MD 05/14/23 1536

## 2023-10-23 ENCOUNTER — Encounter (HOSPITAL_COMMUNITY): Payer: Self-pay | Admitting: *Deleted

## 2023-10-23 ENCOUNTER — Ambulatory Visit (HOSPITAL_COMMUNITY)
Admission: EM | Admit: 2023-10-23 | Discharge: 2023-10-23 | Disposition: A | Discharge status: Home / Self Care | Payer: BC Managed Care – PPO | Attending: Internal Medicine | Admitting: Internal Medicine

## 2023-10-23 ENCOUNTER — Ambulatory Visit (INDEPENDENT_AMBULATORY_CARE_PROVIDER_SITE_OTHER): Payer: BC Managed Care – PPO

## 2023-10-23 DIAGNOSIS — R051 Acute cough: Secondary | ICD-10-CM

## 2023-10-23 DIAGNOSIS — J189 Pneumonia, unspecified organism: Secondary | ICD-10-CM | POA: Diagnosis not present

## 2023-10-23 LAB — POCT INFLUENZA A/B
Influenza A, POC: NEGATIVE
Influenza B, POC: NEGATIVE

## 2023-10-23 MED ORDER — ALBUTEROL SULFATE HFA 108 (90 BASE) MCG/ACT IN AERS: 2.0000 | INHALATION_SPRAY | RESPIRATORY_TRACT | 0 refills | Status: AC | PRN

## 2023-10-23 MED ORDER — ALBUTEROL SULFATE (2.5 MG/3ML) 0.083% IN NEBU
2.5000 mg | INHALATION_SOLUTION | Freq: Once | RESPIRATORY_TRACT | Status: AC
  Administered 2023-10-23: 2.5 mg via RESPIRATORY_TRACT

## 2023-10-23 MED ORDER — ONDANSETRON 4 MG PO TBDP: 4.0000 mg | ORAL_TABLET | Freq: Four times a day (QID) | ORAL | 0 refills | Status: AC

## 2023-10-23 MED ORDER — LEVOFLOXACIN 750 MG PO TABS: 750.0000 mg | ORAL_TABLET | Freq: Every day | ORAL | 0 refills | Status: AC

## 2023-10-23 MED ORDER — ONDANSETRON 4 MG PO TBDP
8.0000 mg | ORAL_TABLET | Freq: Once | ORAL | Status: AC
  Administered 2023-10-23: 8 mg via ORAL

## 2023-10-23 MED ORDER — ONDANSETRON 4 MG PO TBDP
ORAL_TABLET | ORAL | Status: AC
  Filled 2023-10-23: qty 1

## 2023-10-23 MED ORDER — ALBUTEROL SULFATE (2.5 MG/3ML) 0.083% IN NEBU
INHALATION_SOLUTION | RESPIRATORY_TRACT | Status: AC
  Filled 2023-10-23: qty 3

## 2023-10-23 MED ORDER — HYDROCODONE BIT-HOMATROP MBR 5-1.5 MG/5ML PO SOLN: 5.0000 mL | Freq: Every evening | ORAL | 0 refills | Status: AC | PRN

## 2023-10-23 MED ORDER — METHYLPREDNISOLONE 4 MG PO TBPK: ORAL_TABLET | ORAL | 0 refills | Status: AC

## 2023-10-23 NOTE — Discharge Instructions (Signed)
 Please follow up with your primary care doctor next week to recheck your lung sounds and oxygen  If the radiologist conforms the pneumonia, you will need to repeat the chest xray in 6-8 weeks.

## 2023-10-23 NOTE — ED Triage Notes (Signed)
 Pt states he has had vomiting, diarrhea, hoarse, cough and congestion X 2 days. He has been taking robitussin, ASA.   He did have 2 neg covid tests

## 2023-10-23 NOTE — ED Provider Notes (Addendum)
 [facility]    CSN: [id number] Arrival date & time: 10/23/23  1127      History   Chief Complaint Chief Complaint  Patient presents with   Cough   Nasal Congestion   Emesis   Diarrhea   Hoarse    HPI Joshua Spencer is a 43 y.o. male who presents with onset of cough, congestion, horsed, N/V/D x 2 days. Has had 2 negative home covid tests. Has been taking robitussin and ASA. Has vomited 2/3 times today since yesterday. Has had 10+ BM's since yesterday. Has been wheezing, been fatigued and chilling.  Had 2 covid tests at home   Past Medical History:  Diagnosis Date   Migraines     There are no active problems to display for this patient.   History reviewed. No pertinent surgical history.     Home Medications    Prior to Admission medications   Medication Sig Start Date End Date Taking? Authorizing Provider  albuterol  (VENTOLIN  HFA) 108 (90 Base) MCG/ACT inhaler Inhale 2 puffs into the lungs every 4 (four) hours as needed for wheezing or shortness of breath. 10/23/23  Yes Rodriguez-Southworth, Kyra, PA-C  HYDROcodone  bit-homatropine (HYCODAN) 5-1.5 MG/5ML syrup Take 5 mLs by mouth at bedtime as needed for cough. 10/23/23  Yes Rodriguez-Southworth, Erasmus Bistline, PA-C  levofloxacin  (LEVAQUIN ) 750 MG tablet Take 1 tablet (750 mg total) by mouth daily. 10/23/23  Yes Rodriguez-Southworth, Kyra, PA-C  methylPREDNISolone  (MEDROL  DOSEPAK) 4 MG TBPK tablet Take as directed 10/23/23  Yes Rodriguez-Southworth, Kyra, PA-C  ondansetron  (ZOFRAN -ODT) 4 MG disintegrating tablet Take 1 tablet (4 mg total) by mouth every 6 (six) hours. 10/23/23  Yes Rodriguez-Southworth, Kyra, PA-C  diclofenac  (VOLTAREN ) 0.1 % ophthalmic solution Place 1 drop into the right eye 4 (four) times daily as needed. 05/13/23   Rolinda Rogue, MD  ibuprofen  (ADVIL ) 600 MG tablet Take 1 tablet (600 mg total) by mouth every 6 (six) hours as needed. 08/17/19   Van Knee, MD  ondansetron  (ZOFRAN ) 4 MG tablet Take 1  tablet (4 mg total) by mouth every 6 (six) hours. 01/11/21   Arloa Suzen RAMAN, NP  tiZANidine  (ZANAFLEX ) 4 MG tablet Take 1 tablet (4 mg total) by mouth every 8 (eight) hours as needed for muscle spasms. 08/17/19   Van Knee, MD  rizatriptan  (MAXALT -MLT) 10 MG disintegrating tablet Take 1 tablet (10 mg total) by mouth as needed for migraine. May repeat in 2 hours if needed. Maximum 2 tablets in 24 hours 12/17/18 08/02/19  Skeet Juliene SAUNDERS, DO  topiramate  (TOPAMAX ) 50 MG tablet Take 1 tablet (50 mg total) by mouth daily. For 1 week, then increase to 100 mg. 10/28/18 08/02/19  Skeet Juliene SAUNDERS, DO    Family History Family History  Problem Relation Age of Onset   Healthy Mother    Healthy Father     Social History Social History   Tobacco Use   Smoking status: Never   Smokeless tobacco: Never  Vaping Use   Vaping status: Never Used  Substance Use Topics   Alcohol use: No   Drug use: No     Allergies   Patient has no known allergies.   Review of Systems Review of Systems As noted in HPI  Physical Exam Triage Vital Signs ED Triage Vitals  Encounter Vitals Group     BP 10/23/23 1256 115/74     Systolic BP Percentile --      Diastolic BP Percentile --      Pulse Rate 10/23/23  1256 88     Resp 10/23/23 1256 18     Temp 10/23/23 1256 99.6 F (37.6 C)     Temp Source 10/23/23 1256 Oral     SpO2 10/23/23 1256 94 %     Weight --      Height --      Head Circumference --      Peak Flow --      Pain Score 10/23/23 1254 7     Pain Loc --      Pain Education --      Exclude from Growth Chart --    No data found.  Updated Vital Signs BP 115/74 (BP Location: Right Arm)   Pulse 88   Temp 99.6 F (37.6 C) (Oral)   Resp 18   SpO2 94%   Visual Acuity Right Eye Distance:   Left Eye Distance:   Bilateral Distance:    Right Eye Near:   Left Eye Near:    Bilateral Near:     Physical Exam Physical Exam Constitutional:      General: He is not in acute distress.     Appearance: He is not toxic-appearing.  HENT:     Head: Normocephalic.     Right Ear: Tympanic membrane, ear canal and external ear normal.     Left Ear: Ear canal and external ear normal.     Nose: Nose normal.     Mouth/Throat:     Mouth: Mucous membranes are moist.     Pharynx: Oropharynx is clear.  Eyes:     General: No scleral icterus.    Conjunctiva/sclera: Conjunctivae normal.  Cardiovascular:     Rate and Rhythm: Normal rate and regular rhythm.     Heart sounds: No murmur heard.   Pulmonary:     Effort: Pulmonary effort is normal. No respiratory distress.     Breath sounds: Wheezing present.     Comments: Has auditory wheezing Musculoskeletal:        General: Normal range of motion.     Cervical back: Neck supple.  Lymphadenopathy:     Cervical: No cervical adenopathy.  Skin:    General: Skin is warm and dry.     Findings: No rash.  Neurological:     Mental Status: He is alert and oriented to person, place, and time.     Gait: Gait normal.  Psychiatric:        Mood and Affect: Mood normal.        Behavior: Behavior normal.        Thought Content: Thought content normal.        Judgment: Judgment normal.    UC Treatments / Results  Labs (all labs ordered are listed, but only abnormal results are displayed) Labs Reviewed  POCT INFLUENZA A/B  Flu A&B are negative  EKG   Radiology DG Chest 2 View Result Date: 10/23/2023 CLINICAL DATA:  Cough.  Wheezing. EXAM: CHEST - 2 VIEW COMPARISON:  08/17/2019 FINDINGS: Midline trachea. Normal heart size and mediastinal contours. Possible trace left pleural thickening or fluid laterally. No pneumothorax. Left lower lobe patchy airspace disease. Clear right lung. IMPRESSION: Left lower lobe airspace disease, favoring pneumonia. Followup PA and lateral chest X-ray is recommended in 3-4 weeks following trial of antibiotic therapy to ensure resolution and exclude underlying malignancy. Trace left pleural fluid or thickening  suspected laterally. Electronically Signed   By: Rockey Kilts M.D.   On: 10/23/2023 16:07   Preliminary reviewed by me and  lateral view is abnormal. I suspect LLL pneumonia.  Procedures Procedures (including critical care time)  Medications Ordered in UC Medications  albuterol  (PROVENTIL ) (2.5 MG/3ML) 0.083% nebulizer solution 2.5 mg (2.5 mg Nebulization Given 10/23/23 1326)  ondansetron  (ZOFRAN -ODT) disintegrating tablet 8 mg (8 mg Oral Given 10/23/23 1326)    Initial Impression / Assessment and Plan / UC Course  I have reviewed the triage vital signs and the nursing notes. He was given a duo neb and this helped with cough attacks and mildly with wheezing.  He was given Zofran  ODT 8 mg which helped his nausea.  Pertinent labs & imaging results that were available during my care of the patient were reviewed by me and considered in my medical decision making (see chart for details).  Possible pneumonia Gastroenteritis  I placed him on Levaquin , Hycodan, Zofran  and Albuterol  inhaler.  See instructions  Final Clinical Impressions(s) / UC Diagnoses   Final diagnoses:  Acute cough  Pneumonia due to infectious organism, unspecified laterality, unspecified part of lung     Discharge Instructions      Please follow up with your primary care doctor next week to recheck your lung sounds and oxygen  If the radiologist conforms the pneumonia, you will need to repeat the chest xray in 6-8 weeks.      ED Prescriptions     Medication Sig Dispense Auth. Provider   levofloxacin  (LEVAQUIN ) 750 MG tablet Take 1 tablet (750 mg total) by mouth daily. 5 tablet Rodriguez-Southworth, Kyra, PA-C   methylPREDNISolone  (MEDROL  DOSEPAK) 4 MG TBPK tablet Take as directed 21 tablet Rodriguez-Southworth, Kyra, PA-C   albuterol  (VENTOLIN  HFA) 108 (90 Base) MCG/ACT inhaler Inhale 2 puffs into the lungs every 4 (four) hours as needed for wheezing or shortness of breath. 18 g Rodriguez-Southworth, Neziah Vogelgesang,  PA-C   HYDROcodone  bit-homatropine (HYCODAN) 5-1.5 MG/5ML syrup Take 5 mLs by mouth at bedtime as needed for cough. 120 mL Rodriguez-Southworth, Kyra, PA-C   ondansetron  (ZOFRAN -ODT) 4 MG disintegrating tablet Take 1 tablet (4 mg total) by mouth every 6 (six) hours. 20 tablet Rodriguez-Southworth, Kyra, PA-C      I have reviewed the PDMP during this encounter.   Lindi Kyra, PA-C 10/23/23 1417    Rodriguez-Southworth, [place], PA-C 10/23/23 1418    Rodriguez-Southworth, Vaughn, PA-C 10/23/23 1805
